# Patient Record
Sex: Male | Born: 1995
Health system: Southern US, Community
[De-identification: ages and names within clinical notes are randomized; demographics above are authoritative.]

## PROBLEM LIST (undated history)

## (undated) ENCOUNTER — Emergency Department: Discharge status: Absent without leave | Payer: Self-pay

## (undated) DIAGNOSIS — J45909 Unspecified asthma, uncomplicated: Secondary | ICD-10-CM

## (undated) DIAGNOSIS — J309 Allergic rhinitis, unspecified: Secondary | ICD-10-CM

## (undated) DIAGNOSIS — E663 Overweight: Secondary | ICD-10-CM

## (undated) DIAGNOSIS — N442 Benign cyst of testis: Secondary | ICD-10-CM

## (undated) DIAGNOSIS — R569 Unspecified convulsions: Secondary | ICD-10-CM

## (undated) HISTORY — DX: Unspecified asthma, uncomplicated: J45.909

## (undated) HISTORY — DX: Allergic rhinitis, unspecified: J30.9

## (undated) HISTORY — DX: Overweight: E66.3

---
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
## 2018-06-07 ENCOUNTER — Encounter: Payer: Self-pay | Admitting: Emergency Medicine

## 2018-06-07 ENCOUNTER — Emergency Department
Admission: EM | Admit: 2018-06-07 | Discharge: 2018-06-07 | Disposition: A | Discharge status: Home / Self Care | Payer: Medicaid Other | Attending: Emergency Medicine | Admitting: Emergency Medicine

## 2018-06-07 ENCOUNTER — Emergency Department: Payer: Medicaid Other

## 2018-06-07 DIAGNOSIS — J209 Acute bronchitis, unspecified: Secondary | ICD-10-CM | POA: Insufficient documentation

## 2018-06-07 DIAGNOSIS — F17210 Nicotine dependence, cigarettes, uncomplicated: Secondary | ICD-10-CM | POA: Insufficient documentation

## 2018-06-07 DIAGNOSIS — R05 Cough: Secondary | ICD-10-CM | POA: Diagnosis present

## 2018-06-07 DIAGNOSIS — J45909 Unspecified asthma, uncomplicated: Secondary | ICD-10-CM | POA: Insufficient documentation

## 2018-06-07 HISTORY — DX: Unspecified convulsions: R56.9

## 2018-06-07 MED ORDER — AZITHROMYCIN 250 MG PO TABS: ORAL_TABLET | ORAL | 0 refills | Status: DC

## 2018-06-07 NOTE — ED Triage Notes (Signed)
Patient presents to ED via POV from home with c/o nasal congestion, cough and fever. Max fever at home was 101.5. Patient has been taking tylenol at home. Patient noted to have even and non labored respirations. Able to speak full, complete sentences.

## 2018-06-07 NOTE — ED Triage Notes (Signed)
C/O fever, cough, and congestion x 6 days.  AAOx3.  Skin warm and dry.  Last medicated for fever last night.

## 2018-06-07 NOTE — ED Notes (Signed)
See triage note  Presents with cough and some nasal congestion  States he has had fever at home but is afebrile on arrival

## 2018-06-07 NOTE — ED Provider Notes (Signed)
[facility] Emergency Department Provider Note  ____________________________________________   First MD Initiated Contact with Patient 06/07/18 [phone]     (approximate)  I have reviewed the triage vital signs and the nursing notes.   HISTORY  Chief Complaint Cough and Fever    HPI Curtis Burns is a 22 y.o. male presents to the emergency department complaining of cough and congestion with fever.  Fever since last Tuesday.  He states that he did have some body aches.  He states mucus has turned yellow to green.  He denies any chest pain or shortness of breath, nausea or vomiting.    Past Medical History:  Diagnosis Date  . Allergic rhinitis   . Asthma   . Overweight   . Seizures (HCC)     There are no active problems to display for this patient.   History reviewed. No pertinent surgical history.  Prior to Admission medications   Medication Sig Start Date End Date Taking? Authorizing Provider  azithromycin (ZITHROMAX Z-PAK) 250 MG tablet 2 pills today then 1 pill a day for 4 days 06/07/18   Faythe GheeFisher, Susan W, PA-C    Allergies Patient has no known allergies.  Family History  Problem Relation Age of Onset  . Kidney Stones Father   . Kidney Stones Sister     Social History Social History   Tobacco Use  . Smoking status: Current Every Day Smoker    Packs/day: 0.50    Types: Cigarettes  . Smokeless tobacco: Never Used  Substance Use Topics  . Alcohol use: No  . Drug use: No    Review of Systems  Constitutional: Positive fever/chills Eyes: No visual changes. ENT: No sore throat. Respiratory: Positive cough Genitourinary: Negative for dysuria. Musculoskeletal: Negative for back pain. Skin: Negative for rash.    ____________________________________________   PHYSICAL EXAM:  VITAL SIGNS: ED Triage Vitals  Enc Vitals Group     BP 06/07/18 0759 129/72     Pulse Rate 06/07/18 0759 85     Resp 06/07/18 0759 18   Temp 06/07/18 0759 98.5 F (36.9 C)     Temp Source 06/07/18 0759 Oral     SpO2 06/07/18 0759 98 %     Weight 06/07/18 0800 210 lb (95.3 kg)     Height 06/07/18 0800 6\' 1"  (1.854 m)     Head Circumference --      Peak Flow --      Pain Score --      Pain Loc --      Pain Edu? --      Excl. in GC? --     Constitutional: Alert and oriented. Well appearing and in no acute distress. Eyes: Conjunctivae are normal.  Head: Atraumatic. Nose: No congestion/rhinnorhea. Mouth/Throat: Mucous membranes are moist.  It appears normal Neck:  supple no lymphadenopathy noted Cardiovascular: Normal rate, regular rhythm. Heart sounds are normal Respiratory: Normal respiratory effort.  No retractions, lungs c t a  GU: deferred Musculoskeletal: FROM all extremities, warm and well perfused Neurologic:  Normal speech and language.  Skin:  Skin is warm, dry and intact. No rash noted. Psychiatric: Mood and affect are normal. Speech and behavior are normal.  ____________________________________________   LABS (all labs ordered are listed, but only abnormal results are displayed)  Labs Reviewed - No data to display ____________________________________________   ____________________________________________  RADIOLOGY  Chest x-ray is negative  ____________________________________________   PROCEDURES  Procedure(s) performed:No  Procedures    ____________________________________________  INITIAL IMPRESSION / ASSESSMENT AND PLAN / ED COURSE  Pertinent labs & imaging results that were available during my care of the patient were reviewed by me and considered in my medical decision making (see chart for details).   Pt with fever and cough for 1 week.   PE shows dry hacking cough.    cxr is neg for pneumonia  Discussed x-ray results with the patient.  He was given a prescription for Z-Pak.  He is to take over-the-counter Mucinex.  Tylenol and ibuprofen for fever as needed.  He was  given a work note stating that he should not return to work until he is been fever free for 24 hours.  He states he understands will comply.  He is to see his regular doctor if not better in 3 days or return emergency department if worsening.  He was discharged in stable condition.     As part of my medical decision making, I reviewed the following data within the electronic MEDICAL RECORD NUMBER History obtained from family, Nursing notes reviewed and incorporated, Old chart reviewed, Radiograph reviewed chest x-rays negative, Notes from prior ED visits and [place] Controlled Substance Database  ____________________________________________   FINAL CLINICAL IMPRESSION(S) / ED DIAGNOSES  Final diagnoses:  Acute bronchitis, unspecified organism      NEW MEDICATIONS STARTED DURING THIS VISIT:  Discharge Medication List as of 06/07/2018  9:24 AM    START taking these medications   Details  azithromycin (ZITHROMAX Z-PAK) 250 MG tablet 2 pills today then 1 pill a day for 4 days, Normal         Note:  This document was prepared using Dragon voice recognition software and may include unintentional dictation errors.    Faythe Ghee, PA-C 06/07/18 1610    Sharman Cheek, MD 06/07/18 [phone]

## 2018-06-13 ENCOUNTER — Encounter: Payer: Self-pay | Admitting: Emergency Medicine

## 2018-06-13 ENCOUNTER — Emergency Department
Admission: EM | Admit: 2018-06-13 | Discharge: 2018-06-13 | Disposition: A | Discharge status: Home / Self Care | Payer: Medicaid Other | Attending: Emergency Medicine | Admitting: Emergency Medicine

## 2018-06-13 ENCOUNTER — Other Ambulatory Visit: Payer: Self-pay

## 2018-06-13 DIAGNOSIS — R070 Pain in throat: Secondary | ICD-10-CM | POA: Diagnosis present

## 2018-06-13 DIAGNOSIS — R0602 Shortness of breath: Secondary | ICD-10-CM | POA: Insufficient documentation

## 2018-06-13 DIAGNOSIS — J45909 Unspecified asthma, uncomplicated: Secondary | ICD-10-CM | POA: Insufficient documentation

## 2018-06-13 DIAGNOSIS — F17210 Nicotine dependence, cigarettes, uncomplicated: Secondary | ICD-10-CM | POA: Insufficient documentation

## 2018-06-13 DIAGNOSIS — J209 Acute bronchitis, unspecified: Secondary | ICD-10-CM | POA: Diagnosis not present

## 2018-06-13 MED ORDER — IPRATROPIUM BROMIDE 0.02 % IN SOLN: 0.5000 mg | Freq: Four times a day (QID) | RESPIRATORY_TRACT | 0 refills | Status: DC

## 2018-06-13 MED ORDER — IPRATROPIUM-ALBUTEROL 0.5-2.5 (3) MG/3ML IN SOLN
3.0000 mL | Freq: Once | RESPIRATORY_TRACT | Status: AC
  Administered 2018-06-13: 3 mL via RESPIRATORY_TRACT
  Filled 2018-06-13: qty 3

## 2018-06-13 MED ORDER — PREDNISONE 10 MG PO TABS: ORAL_TABLET | ORAL | 0 refills | Status: DC

## 2018-06-13 NOTE — Discharge Instructions (Signed)
Follow-up with Dr. Charise Carwinhristman if any continued problems.  Begin taking prednisone today and also continue with your nebulizer treatments as needed.  Increase fluids.  Decrease smoking or discontinue completely.

## 2018-06-13 NOTE — ED Provider Notes (Signed)
[facility] Emergency Department Provider Note  ____________________________________________   First MD Initiated Contact with Patient 06/13/18 1037     (approximate)  I have reviewed the triage vital signs and the nursing notes.   HISTORY  Chief Complaint Sore Throat and Shortness of Breath   HPI Curtis Burns is a 22 y.o. male presents to the ED with complaint of continued sore throat, cough and shortness of breath.  Patient was seen approximately 1 week ago at which time his chest x-ray was reported as negative.  Patient was given a Z-Pak which he finished 2 days ago.  Father states that he was doing much better but then went out, got wet, began having symptoms.  Patient has continued talking complete sentences-not had any respiratory difficulties.  Patient has nebulizer machine at home which he used last evening.  He rates his pain as 5/10.  Patient also smokes 1/2 pack cigarettes per day and vapes.   Past Medical History:  Diagnosis Date  . Allergic rhinitis   . Asthma   . Overweight   . Seizures (HCC)     There are no active problems to display for this patient.   History reviewed. No pertinent surgical history.  Prior to Admission medications   Medication Sig Start Date End Date Taking? Authorizing Provider  azithromycin (ZITHROMAX Z-PAK) 250 MG tablet 2 pills today then 1 pill a day for 4 days 06/07/18   Sherrie Mustache Roselyn Bering, PA-C  ipratropium (ATROVENT) 0.02 % nebulizer solution Take 2.5 mLs (0.5 mg total) by nebulization 4 (four) times daily. 06/13/18   Tommi Rumps, PA-C  predniSONE (DELTASONE) 10 MG tablet Take 6 tablets  today, on day 2 take 5 tablets, day 3 take 4 tablets, day 4 take 3 tablets, day 5 take  2 tablets and 1 tablet the last day 06/13/18   Tommi Rumps, PA-C    Allergies Patient has no known allergies.  Family History  Problem Relation Age of Onset  . Kidney Stones Father   . Kidney Stones Sister      Social History Social History   Tobacco Use  . Smoking status: Current Every Day Smoker    Packs/day: 0.50    Types: Cigarettes  . Smokeless tobacco: Never Used  Substance Use Topics  . Alcohol use: No  . Drug use: No    Review of Systems Constitutional: No fever/chills Eyes: No visual changes. ENT: Positive sore throat.  Needed for ear pain. Cardiovascular: Denies chest pain. Respiratory: Positive shortness of breath and productive cough. Gastrointestinal: No abdominal pain.  No nausea, no vomiting.  Musculoskeletal: Negative for muscle aches. Skin: Negative for rash. Neurological: Negative for headaches, focal weakness or numbness. ___________________________________________   PHYSICAL EXAM:  VITAL SIGNS: ED Triage Vitals  Enc Vitals Group     BP 06/13/18 1017 (!) 112/52     Pulse Rate 06/13/18 1017 61     Resp 06/13/18 1017 20     Temp 06/13/18 1017 98.1 F (36.7 C)     Temp Source 06/13/18 1017 Oral     SpO2 06/13/18 1017 100 %     Weight 06/13/18 1018 210 lb (95.3 kg)     Height 06/13/18 1018 6\' 1"  (1.854 m)     Head Circumference --      Peak Flow --      Pain Score 06/13/18 1022 5     Pain Loc --      Pain Edu? --  Excl. in GC? --    Constitutional: Alert and oriented. Well appearing and in no acute distress.  Patient is able talking complete sentences without any difficulties and no distress was noted. Eyes: Conjunctivae are normal.  Head: Atraumatic. Nose: Mild congestion/rhinnorhea. Mouth/Throat: Mucous membranes are moist.  Oropharynx non-erythematous.  No exudate, posterior drainage present.  Uvula is midline. Neck: No stridor.   Hematological/Lymphatic/Immunilogical: No cervical lymphadenopathy. Cardiovascular: Normal rate, regular rhythm. Grossly normal heart sounds.  Good peripheral circulation. Respiratory: Normal respiratory effort.  No retractions. Lungs CTAB occasional congested cough.  Patient is able to speak in complete sentences  without any difficulties. Gastrointestinal: Soft and nontender. No distention.  Musculoskeletal: Moves upper and lower extremities without any difficulty and normal gait was noted. Neurologic:  Normal speech and language. No gross focal neurologic deficits are appreciated. No gait instability. Skin:  Skin is warm, dry and intact. No rash noted. Psychiatric: Mood and affect are normal. Speech and behavior are normal.  ____________________________________________   LABS (all labs ordered are listed, but only abnormal results are displayed)  Labs Reviewed - No data to display  RADIOLOGY  ED MD interpretation:   Chest x-ray today is deferred an x-ray from 06/07/2018 was reviewed.  Official radiology report(s): No results found.  ____________________________________________   PROCEDURES  Procedure(s) performed: None  Procedures  Critical Care performed: No  ____________________________________________   INITIAL IMPRESSION / ASSESSMENT AND PLAN / ED COURSE  As part of my medical decision making, I reviewed the following data within the electronic MEDICAL RECORD NUMBER Notes from prior ED visits and [name] Controlled Substance Database  22 year old male cigarette smoker presents to the ED today with complaint of continued cough.  He was seen on 06/07/2018 at which time his chest x-ray was negative.  He was placed on Zithromax which he finished 2 days ago.  Patient states he has had a history of bronchitis.  He denies any fever.  Physical exam is more consistent with bronchitis and patient is a cigarette smoker.  He was encouraged to discontinue smoking.  He was given a nebulizer treatment in the ED with improvement of his cough.  Patient will start prednisone 60 mg 6-day taper and to follow-up with his PCP if any continued problems.  He was also given a refill on his nebulizer solution.  ____________________________________________   FINAL CLINICAL IMPRESSION(S) / ED DIAGNOSES  Final  diagnoses:  Acute bronchitis, unspecified organism  Cigarette smoker     ED Discharge Orders         Ordered    ipratropium (ATROVENT) 0.02 % nebulizer solution  4 times daily     06/13/18 1133    predniSONE (DELTASONE) 10 MG tablet     06/13/18 1133           Note:  This document was prepared using Dragon voice recognition software and may include unintentional dictation errors.    Tommi RumpsSummers,  L, PA-C 06/13/18 1221    Minna AntisPaduchowski, Kevin, MD 06/13/18 1419

## 2018-06-13 NOTE — ED Triage Notes (Signed)
Pt to ED via POV, pt states that he finished antibiotics 2 days ago for bronchitis, pt reports that he is having sore throat and some shortness of breath. Pt thinks he may be getting pneumonia. Pt is able to talk in complete sentences at this time, no respiratory distress noted, pt is in NAD.

## 2018-06-22 ENCOUNTER — Emergency Department: Payer: Medicaid Other

## 2018-06-22 ENCOUNTER — Other Ambulatory Visit: Payer: Self-pay

## 2018-06-22 ENCOUNTER — Emergency Department
Admission: EM | Admit: 2018-06-22 | Discharge: 2018-06-22 | Disposition: A | Discharge status: Home / Self Care | Payer: Medicaid Other | Attending: Emergency Medicine | Admitting: Emergency Medicine

## 2018-06-22 ENCOUNTER — Encounter: Payer: Self-pay | Admitting: Emergency Medicine

## 2018-06-22 DIAGNOSIS — F17210 Nicotine dependence, cigarettes, uncomplicated: Secondary | ICD-10-CM | POA: Diagnosis not present

## 2018-06-22 DIAGNOSIS — N453 Epididymo-orchitis: Secondary | ICD-10-CM | POA: Diagnosis not present

## 2018-06-22 DIAGNOSIS — N50812 Left testicular pain: Secondary | ICD-10-CM

## 2018-06-22 DIAGNOSIS — J45909 Unspecified asthma, uncomplicated: Secondary | ICD-10-CM | POA: Insufficient documentation

## 2018-06-22 LAB — URINALYSIS, COMPLETE (UACMP) WITH MICROSCOPIC
Bacteria, UA: NONE SEEN
Bilirubin Urine: NEGATIVE
Glucose, UA: NEGATIVE mg/dL
Hgb urine dipstick: NEGATIVE
Ketones, ur: NEGATIVE mg/dL
Leukocytes, UA: NEGATIVE
Nitrite: NEGATIVE
Protein, ur: NEGATIVE mg/dL
Specific Gravity, Urine: 1.014 (ref 1.005–1.030)
Squamous Epithelial / HPF: NONE SEEN (ref 0–5)
pH: 5 (ref 5.0–8.0)

## 2018-06-22 MED ORDER — CEFTRIAXONE SODIUM 250 MG IJ SOLR
250.0000 mg | INTRAMUSCULAR | Status: DC
  Administered 2018-06-22: 250 mg via INTRAMUSCULAR
  Filled 2018-06-22: qty 250

## 2018-06-22 MED ORDER — DOXYCYCLINE HYCLATE 100 MG PO CAPS: 100.0000 mg | ORAL_CAPSULE | Freq: Two times a day (BID) | ORAL | 0 refills | Status: AC

## 2018-06-22 MED ORDER — IBUPROFEN 800 MG PO TABS: 800.0000 mg | ORAL_TABLET | Freq: Three times a day (TID) | ORAL | 0 refills | Status: DC | PRN

## 2018-06-22 NOTE — ED Notes (Signed)
Pt alert and oriented X4, active, cooperative, pt in NAD. RR even and unlabored, color WNL.  Pt informed to return if any life threatening symptoms occur.  Discharge and followup instructions reviewed. Ambulates safely. 

## 2018-06-22 NOTE — ED Provider Notes (Signed)
[facility] Emergency Department Provider Note       Time seen: ----------------------------------------- 9:21 AM on 06/22/2018 -----------------------------------------   I have reviewed the triage vital signs and the nursing notes.  HISTORY   Chief Complaint Testicle Pain    HPI Curtis Burns is a 22 y.o. male with a history of allergic rhinitis, asthma, seizures who presents to the ED for left-sided testicular pain that started yesterday morning.  Patient has not had any swelling that he knows of, denies any dysuria or discharge.  He does state that he is having unprotected sex.  Past Medical History:  Diagnosis Date  . Allergic rhinitis   . Asthma   . Overweight   . Seizures (HCC)     There are no active problems to display for this patient.   History reviewed. No pertinent surgical history.  Allergies Prednisone  Social History Social History   Tobacco Use  . Smoking status: Current Every Day Smoker    Packs/day: 0.50    Types: Cigarettes  . Smokeless tobacco: Never Used  Substance Use Topics  . Alcohol use: No  . Drug use: No   Review of Systems Constitutional: Negative for fever. Cardiovascular: Negative for chest pain. Respiratory: Negative for shortness of breath. Gastrointestinal: Negative for abdominal pain, vomiting and diarrhea. Genitourinary: Positive for left-sided testicular pain Musculoskeletal: Negative for back pain. Skin: Negative for rash. Neurological: Negative for headaches, focal weakness or numbness.  All systems negative/normal/unremarkable except as stated in the HPI  ____________________________________________   PHYSICAL EXAM:  VITAL SIGNS: ED Triage Vitals  Enc Vitals Group     BP 06/22/18 0758 (!) 124/51     Pulse Rate 06/22/18 0758 69     Resp 06/22/18 0758 16     Temp 06/22/18 0758 98.1 F (36.7 C)     Temp Source 06/22/18 0758 Oral     SpO2 06/22/18 0758 99 %     Weight  06/22/18 0759 209 lb 14.1 oz (95.2 kg)     Height 06/22/18 0759 6\' 1"  (1.854 m)     Head Circumference --      Peak Flow --      Pain Score 06/22/18 0758 6     Pain Loc --      Pain Edu? --      Excl. in GC? --    Constitutional: Alert and oriented. Well appearing and in no distress. Cardiovascular: Normal rate, regular rhythm. No murmurs, rubs, or gallops. Respiratory: Normal respiratory effort without tachypnea nor retractions. Breath sounds are clear and equal bilaterally. No wheezes/rales/rhonchi. Gastrointestinal: Soft and nontender. Normal bowel sounds Genitourinary: Mild left testicular tenderness with mild swelling.No hernia is present Musculoskeletal: Nontender with normal range of motion in extremities. No lower extremity tenderness nor edema. Neurologic:  Normal speech and language. No gross focal neurologic deficits are appreciated.  Skin:  Skin is warm, dry and intact. No rash noted.  ____________________________________________  ED COURSE:  As part of my medical decision making, I reviewed the following data within the electronic MEDICAL RECORD NUMBER History obtained from family if available, nursing notes, old chart and ekg, as well as notes from prior ED visits. Patient presented for left-sided testicular pain, we will assess with labs and imaging as indicated at this time.   Procedures ____________________________________________   LABS (pertinent positives/negatives)  Labs Reviewed  URINALYSIS, COMPLETE (UACMP) WITH MICROSCOPIC - Abnormal; Notable for the following components:      Result Value   Color, Urine  YELLOW (*)    APPearance CLEAR (*)    All other components within normal limits    RADIOLOGY  Testicular ultrasound IMPRESSION: 1. LEFT orchitis. 2. No evidence of testicular torsion. 3. Cysts or spermatoceles involving both epididymal heads. 4. LEFT sided varicocele. ____________________________________________  DIFFERENTIAL DIAGNOSIS    Epididymitis, orchitis, cellulitis  FINAL ASSESSMENT AND PLAN  Epididymal orchitis   Plan: The patient had presented for left-sided testicular pain which is likely secondary to chlamydia. Patient's imaging revealed left orchitis.  Patient will be given Rocephin IM here and treated with oral doxycycline.   Ulice DashJohnathan E Jazzmin Newbold, MD   Note: This note was generated in part or whole with voice recognition software. Voice recognition is usually quite accurate but there are transcription errors that can and very often do occur. I apologize for any typographical errors that were not detected and corrected.     Emily FilbertWilliams, Amariyon Maynes E, MD 06/22/18 [phone]

## 2018-06-22 NOTE — ED Triage Notes (Signed)
Left testicle pain started yesterday am.

## 2018-09-13 ENCOUNTER — Emergency Department: Payer: 59

## 2018-09-13 ENCOUNTER — Encounter: Payer: Self-pay | Admitting: Emergency Medicine

## 2018-09-13 ENCOUNTER — Emergency Department
Admission: EM | Admit: 2018-09-13 | Discharge: 2018-09-13 | Disposition: A | Discharge status: Home / Self Care | Payer: 59 | Attending: Emergency Medicine | Admitting: Emergency Medicine

## 2018-09-13 DIAGNOSIS — J45909 Unspecified asthma, uncomplicated: Secondary | ICD-10-CM | POA: Insufficient documentation

## 2018-09-13 DIAGNOSIS — Z79899 Other long term (current) drug therapy: Secondary | ICD-10-CM | POA: Insufficient documentation

## 2018-09-13 DIAGNOSIS — F17210 Nicotine dependence, cigarettes, uncomplicated: Secondary | ICD-10-CM | POA: Insufficient documentation

## 2018-09-13 DIAGNOSIS — N442 Benign cyst of testis: Secondary | ICD-10-CM | POA: Insufficient documentation

## 2018-09-13 LAB — CHLAMYDIA/NGC RT PCR (ARMC ONLY)
CHLAMYDIA TR: NOT DETECTED
N GONORRHOEAE: NOT DETECTED

## 2018-09-13 MED ORDER — NAPROXEN 500 MG PO TABS: 500.0000 mg | ORAL_TABLET | Freq: Two times a day (BID) | ORAL | Status: DC

## 2018-09-13 MED ORDER — NAPROXEN 500 MG PO TABS
500.0000 mg | ORAL_TABLET | Freq: Once | ORAL | Status: AC
  Administered 2018-09-13: 500 mg via ORAL
  Filled 2018-09-13: qty 1

## 2018-09-13 MED ORDER — TRAMADOL HCL 50 MG PO TABS: 50.0000 mg | ORAL_TABLET | Freq: Two times a day (BID) | ORAL | 0 refills | Status: DC | PRN

## 2018-09-13 MED ORDER — TRAMADOL HCL 50 MG PO TABS
50.0000 mg | ORAL_TABLET | Freq: Once | ORAL | Status: AC
Start: 2018-09-13 — End: 2018-09-13
  Administered 2018-09-13: 50 mg via ORAL
  Filled 2018-09-13: qty 1

## 2018-09-13 MED ORDER — HYDROMORPHONE HCL 1 MG/ML IJ SOLN: 1.0000 mg | Freq: Once | INTRAMUSCULAR | Status: DC

## 2018-09-13 NOTE — Discharge Instructions (Signed)
Follow-up with urology if as needed.

## 2018-09-13 NOTE — ED Triage Notes (Signed)
Pt reports swelling and pain to right testicle for the past 2-3 days. Pt denies injuries or urinary sx's. Pt reports has been seen in the past for the same but it was his left testicle.

## 2018-09-13 NOTE — ED Provider Notes (Signed)
[facility] Emergency Department Provider Note   ____________________________________________   First MD Initiated Contact with Patient 09/13/18 [phone]     (approximate)  I have reviewed the triage vital signs and the nursing notes.   HISTORY  Chief Complaint Testicle Pain and Groin Swelling     HPI Curtis Burns is a 23 y.o. male patient presents with right scrotal pain and edema.  Patient states similar complaint 2 years ago on the left side.  Patient denies dysuria or urethral discharge.  Patient denies high risk sexual encounter.  Patient rates his pain as a 5/10.  No palliative measure for complaint.  Patient described the pain is "achy".         Past Medical History:  Diagnosis Date  . Allergic rhinitis   . Asthma   . Overweight   . Seizures (HCC)     There are no active problems to display for this patient.   History reviewed. No pertinent surgical history.  Prior to Admission medications   Medication Sig Start Date End Date Taking? Authorizing Provider  ipratropium (ATROVENT) 0.02 % nebulizer solution Take 2.5 mLs (0.5 mg total) by nebulization 4 (four) times daily. 06/13/18   Tommi Rumps, PA-C  naproxen (NAPROSYN) 500 MG tablet Take 1 tablet (500 mg total) by mouth 2 (two) times daily with a meal. 09/13/18   Joni Reining, PA-C  traMADol (ULTRAM) 50 MG tablet Take 1 tablet (50 mg total) by mouth every 12 (twelve) hours as needed. 09/13/18   Joni Reining, PA-C    Allergies Prednisone  Family History  Problem Relation Age of Onset  . Kidney Stones Father   . Kidney Stones Sister     Social History Social History   Tobacco Use  . Smoking status: Current Every Day Smoker    Packs/day: 0.50    Types: Cigarettes  . Smokeless tobacco: Never Used  Substance Use Topics  . Alcohol use: No  . Drug use: No    Review of Systems  Constitutional: No fever/chills Eyes: No visual changes. ENT: No sore  throat. Cardiovascular: Denies chest pain. Respiratory: Denies shortness of breath. Gastrointestinal: No abdominal pain.  No nausea, no vomiting.  No diarrhea.  No constipation. Genitourinary: Negative for dysuria.  Left scrotum pain. Musculoskeletal: Negative for back pain. Skin: Negative for rash. Neurological: Negative for headaches, focal weakness or numbness.   ____________________________________________   PHYSICAL EXAM:  VITAL SIGNS: ED Triage Vitals [09/13/18 0819]  Enc Vitals Group     BP (!) 132/59     Pulse Rate 61     Resp 20     Temp 98.2 F (36.8 C)     Temp Source Oral     SpO2 98 %     Weight 220 lb (99.8 kg)     Height 6\' 1"  (1.854 m)     Head Circumference      Peak Flow      Pain Score 5     Pain Loc      Pain Edu?      Excl. in GC?    Constitutional: Alert and oriented. Well appearing and in no acute distress. Neck: No stridor.  Hematological/Lymphatic/Immunilogical: No cervical lymphadenopathy. Cardiovascular: Normal rate, regular rhythm. Grossly normal heart sounds.  Good peripheral circulation. Respiratory: Normal respiratory effort.  No retractions. Lungs CTAB. Gastrointestinal: Soft and nontender. No distention. No abdominal bruits. No CVA tenderness. Genitourinary: No obvious scrotum edema.  Palpable cystic lesion right scrotum.  Musculoskeletal: No lower extremity tenderness nor edema.  No joint effusions. Neurologic:  Normal speech and language. No gross focal neurologic deficits are appreciated. No gait instability. Skin:  Skin is warm, dry and intact. No rash noted. Psychiatric: Mood and affect are normal. Speech and behavior are normal.  ____________________________________________   LABS (all labs ordered are listed, but only abnormal results are displayed)  Labs Reviewed  CHLAMYDIA/NGC RT PCR [facility] ONLY)    ____________________________________________  EKG   ____________________________________________  RADIOLOGY  ED MD interpretation:   Official radiology report(s): US Scrotum W/doppler  Result Date: 09/13/2018 CLINICAL DATA:  Right testicular pain and swelling 2-3 days. EXAM: SCROTAL ULTRASOUND DOPPLER ULTRASOUND OF THE TESTICLES TECHNIQUE: Complete ultrasound examination of the testicles, epididymis, and other scrotal structures was performed. Color and spectral Doppler ultrasound were also utilized to evaluate blood flow to the testicles. COMPARISON:  None. FINDINGS: Right testicle Measurements: 2.0 x 3.1 x 4.9 cm. No mass or microlithiasis visualized. Left testicle Measurements: 1.7 x 2.7 x 3.5 cm. No mass or microlithiasis visualized. Right epididymis: Normal in size and appearance. 4 mm cyst over the epididymal head. Left epididymis: Normal in size and appearance. 1.2 cm cyst over the epididymal head. Hydrocele:  None visualized. Varicocele:  None visualized. Pulsed Doppler interrogation of both testes demonstrates normal low resistance arterial and venous waveforms bilaterally. IMPRESSION: Normal testicular ultrasound without evidence of focal mass or torsion. Bilateral epididymal cysts. Electronically Signed   By: Elberta Fortis M.D.   On: 09/13/2018 09:10    ____________________________________________   PROCEDURES  Procedure(s) performed (including Critical Care):  Procedures   ____________________________________________   INITIAL IMPRESSION / ASSESSMENT AND PLAN / ED COURSE  As part of my medical decision making, I reviewed the following data within the electronic MEDICAL RECORD NUMBER         Patient presents with right scrotum pain.  Ultrasound revealed 4 mm epididymal cyst.  Patient given discharge care instruction advised follow urology for definitive evaluation and treatment.      ____________________________________________   FINAL CLINICAL IMPRESSION(S) / ED  DIAGNOSES  Final diagnoses:  Benign cyst of both testicles     ED Discharge Orders         Ordered    traMADol (ULTRAM) 50 MG tablet  Every 12 hours PRN     09/13/18 1120    naproxen (NAPROSYN) 500 MG tablet  2 times daily with meals     09/13/18 1120           Note:  This document was prepared using Dragon voice recognition software and may include unintentional dictation errors.    Joni Reining, PA-C 09/13/18 1143    Sharman Cheek, MD 09/17/18 [phone]

## 2018-09-13 NOTE — ED Notes (Signed)
See triage note  Presents with testicle swelling   States he noticed this couple of days ago   Hx of same but to other side  Denies any trauma or diff with urination

## 2019-01-17 ENCOUNTER — Ambulatory Visit: Payer: Self-pay | Admitting: Family Medicine

## 2019-01-27 ENCOUNTER — Emergency Department: Payer: Self-pay

## 2019-01-27 ENCOUNTER — Other Ambulatory Visit: Payer: Self-pay

## 2019-01-27 ENCOUNTER — Emergency Department
Admission: EM | Admit: 2019-01-27 | Discharge: 2019-01-27 | Disposition: A | Discharge status: Home / Self Care | Payer: Self-pay | Attending: Emergency Medicine | Admitting: Emergency Medicine

## 2019-01-27 ENCOUNTER — Encounter: Payer: Self-pay | Admitting: Emergency Medicine

## 2019-01-27 DIAGNOSIS — R197 Diarrhea, unspecified: Secondary | ICD-10-CM | POA: Insufficient documentation

## 2019-01-27 DIAGNOSIS — R112 Nausea with vomiting, unspecified: Secondary | ICD-10-CM | POA: Insufficient documentation

## 2019-01-27 DIAGNOSIS — F17210 Nicotine dependence, cigarettes, uncomplicated: Secondary | ICD-10-CM | POA: Insufficient documentation

## 2019-01-27 DIAGNOSIS — J45909 Unspecified asthma, uncomplicated: Secondary | ICD-10-CM | POA: Insufficient documentation

## 2019-01-27 DIAGNOSIS — N23 Unspecified renal colic: Secondary | ICD-10-CM | POA: Insufficient documentation

## 2019-01-27 HISTORY — DX: Benign cyst of testis: N44.2

## 2019-01-27 LAB — URINALYSIS, COMPLETE (UACMP) WITH MICROSCOPIC
Bacteria, UA: NONE SEEN
Bilirubin Urine: NEGATIVE
Glucose, UA: NEGATIVE mg/dL
Ketones, ur: NEGATIVE mg/dL
Leukocytes,Ua: NEGATIVE
Nitrite: NEGATIVE
Protein, ur: NEGATIVE mg/dL
Specific Gravity, Urine: >1.046 — ABNORMAL HIGH (ref 1.005–1.030)
pH: 5 (ref 5.0–8.0)

## 2019-01-27 LAB — CBC WITH DIFFERENTIAL/PLATELET
Abs Immature Granulocytes: 0.03 10*3/uL (ref 0.00–0.07)
Basophils Absolute: 0 10*3/uL (ref 0.0–0.1)
Basophils Relative: 0 %
Eosinophils Absolute: 0.1 10*3/uL (ref 0.0–0.5)
Eosinophils Relative: 1 %
HCT: 45.5 % (ref 39.0–52.0)
Hemoglobin: 14.9 g/dL (ref 13.0–17.0)
Immature Granulocytes: 0 %
Lymphocytes Relative: 13 %
Lymphs Abs: 1.5 10*3/uL (ref 0.7–4.0)
MCH: 29.7 pg (ref 26.0–34.0)
MCHC: 32.7 g/dL (ref 30.0–36.0)
MCV: 90.6 fL (ref 80.0–100.0)
Monocytes Absolute: 0.6 10*3/uL (ref 0.1–1.0)
Monocytes Relative: 6 %
Neutro Abs: 8.9 10*3/uL — ABNORMAL HIGH (ref 1.7–7.7)
Neutrophils Relative %: 80 %
Platelets: 290 10*3/uL (ref 150–400)
RBC: 5.02 MIL/uL (ref 4.22–5.81)
RDW: 12.4 % (ref 11.5–15.5)
WBC: 11.1 10*3/uL — ABNORMAL HIGH (ref 4.0–10.5)
nRBC: 0 % (ref 0.0–0.2)

## 2019-01-27 LAB — COMPREHENSIVE METABOLIC PANEL
ALT: 31 U/L (ref 0–44)
AST: 22 U/L (ref 15–41)
Albumin: 4.7 g/dL (ref 3.5–5.0)
Alkaline Phosphatase: 56 U/L (ref 38–126)
Anion gap: 10 (ref 5–15)
BUN: 16 mg/dL (ref 6–20)
CO2: 27 mmol/L (ref 22–32)
Calcium: 9.6 mg/dL (ref 8.9–10.3)
Chloride: 104 mmol/L (ref 98–111)
Creatinine, Ser: 1.15 mg/dL (ref 0.61–1.24)
GFR calc Af Amer: >60 mL/min (ref >60)
GFR calc non Af Amer: >60 mL/min (ref >60)
Glucose, Bld: 116 mg/dL — ABNORMAL HIGH (ref 70–99)
Potassium: 3.5 mmol/L (ref 3.5–5.1)
Sodium: 141 mmol/L (ref 135–145)
Total Bilirubin: 0.9 mg/dL (ref 0.3–1.2)
Total Protein: 7.7 g/dL (ref 6.5–8.1)

## 2019-01-27 LAB — LIPASE, BLOOD: Lipase: 33 U/L (ref 11–51)

## 2019-01-27 MED ORDER — KETOROLAC TROMETHAMINE 30 MG/ML IJ SOLN
30.0000 mg | Freq: Once | INTRAMUSCULAR | Status: AC
  Administered 2019-01-27: 11:00:00 30 mg via INTRAVENOUS
  Filled 2019-01-27: qty 1

## 2019-01-27 MED ORDER — TAMSULOSIN HCL 0.4 MG PO CAPS: 0.4000 mg | ORAL_CAPSULE | Freq: Every day | ORAL | 0 refills | Status: DC

## 2019-01-27 MED ORDER — ONDANSETRON HCL 4 MG/2ML IJ SOLN
4.0000 mg | Freq: Once | INTRAMUSCULAR | Status: AC
  Administered 2019-01-27: 10:00:00 4 mg via INTRAVENOUS
  Filled 2019-01-27: qty 2

## 2019-01-27 MED ORDER — ONDANSETRON HCL 4 MG/2ML IJ SOLN
4.0000 mg | Freq: Once | INTRAMUSCULAR | Status: AC
  Administered 2019-01-27: 10:00:00 4 mg via INTRAVENOUS

## 2019-01-27 MED ORDER — MORPHINE SULFATE (PF) 4 MG/ML IV SOLN
4.0000 mg | Freq: Once | INTRAVENOUS | Status: AC
  Administered 2019-01-27: 10:00:00 4 mg via INTRAVENOUS
  Filled 2019-01-27: qty 1

## 2019-01-27 MED ORDER — HYDROMORPHONE HCL 1 MG/ML IJ SOLN
1.0000 mg | Freq: Once | INTRAMUSCULAR | Status: AC
  Administered 2019-01-27: 10:00:00 1 mg via INTRAVENOUS
  Filled 2019-01-27: qty 1

## 2019-01-27 MED ORDER — ONDANSETRON HCL 4 MG/2ML IJ SOLN
INTRAMUSCULAR | Status: AC
  Filled 2019-01-27: qty 2

## 2019-01-27 MED ORDER — IOHEXOL 300 MG/ML  SOLN
100.0000 mL | Freq: Once | INTRAMUSCULAR | Status: AC | PRN
  Administered 2019-01-27: 100 mL via INTRAVENOUS

## 2019-01-27 MED ORDER — OXYCODONE-ACETAMINOPHEN 5-325 MG PO TABS: 1.0000 | ORAL_TABLET | Freq: Three times a day (TID) | ORAL | 0 refills | Status: DC | PRN

## 2019-01-27 MED ORDER — ONDANSETRON 4 MG PO TBDP: 4.0000 mg | ORAL_TABLET | Freq: Three times a day (TID) | ORAL | 0 refills | Status: DC | PRN

## 2019-01-27 NOTE — ED Provider Notes (Signed)
[facility] Emergency Department Provider Note       Time seen: ----------------------------------------- 9:45 AM on 01/27/2019 -----------------------------------------   I have reviewed the triage vital signs and the nursing notes.  HISTORY   Chief Complaint Abdominal Pain    HPI Curtis Burns is a 23 y.o. male with a history of asthma, seizures who presents to the ED for right lower quadrant abdominal pain with nausea, vomiting and diarrhea for 3 days.  Patient states he is never had pain like this before, nothing makes it better or worse.  Patient presents diaphoretic and severe pain.  Past Medical History:  Diagnosis Date  . Allergic rhinitis   . Asthma   . Overweight   . Seizures (HCC)     There are no active problems to display for this patient.   No past surgical history on file.  Allergies Prednisone  Social History Social History   Tobacco Use  . Smoking status: Current Every Day Smoker    Packs/day: 0.50    Types: Cigarettes  . Smokeless tobacco: Never Used  Substance Use Topics  . Alcohol use: No  . Drug use: No   Review of Systems Constitutional: Negative for fever. Cardiovascular: Negative for chest pain. Respiratory: Negative for shortness of breath. Gastrointestinal: Positive for abdominal pain, vomiting and diarrhea Musculoskeletal: Negative for back pain. Skin: Positive for diaphoresis Neurological: Negative for headaches, focal weakness or numbness.  All systems negative/normal/unremarkable except as stated in the HPI  ____________________________________________   PHYSICAL EXAM:  VITAL SIGNS: ED Triage Vitals  Enc Vitals Group     BP --      Pulse Rate 01/27/19 0944 91     Resp 01/27/19 0944 16     Temp 01/27/19 0944 98.8 F (37.1 C)     Temp Source 01/27/19 0944 Oral     SpO2 01/27/19 0944 94 %     Weight 01/27/19 0945 220 lb (99.8 kg)     Height 01/27/19 0945 6\' 1"  (1.854 m)     Head  Circumference --      Peak Flow --      Pain Score 01/27/19 0944 10     Pain Loc --      Pain Edu? --      Excl. in GC? --     Constitutional: Alert and oriented.  Moderate distress from pain Eyes: Conjunctivae are normal. Normal extraocular movements. Cardiovascular: Normal rate, regular rhythm. No murmurs, rubs, or gallops. Respiratory: Normal respiratory effort without tachypnea nor retractions. Breath sounds are clear and equal bilaterally. No wheezes/rales/rhonchi. Gastrointestinal: Right lower quadrant tenderness, some guarding is noted, hypoactive bowel sounds Musculoskeletal: Nontender with normal range of motion in extremities. No lower extremity tenderness nor edema. Neurologic:  Normal speech and language. No gross focal neurologic deficits are appreciated.  Skin: Diaphoresis is noted Psychiatric: Mood and affect are normal. Speech and behavior are normal.  ____________________________________________  ED COURSE:  As part of my medical decision making, I reviewed the following data within the electronic MEDICAL RECORD NUMBER History obtained from family if available, nursing notes, old chart and ekg, as well as notes from prior ED visits. Patient presented for abdominal pain with vomiting and diarrhea, we will assess with labs and imaging as indicated at this time.   Procedures  Curtis Burns was evaluated in Emergency Department on 01/27/2019 for the symptoms described in the history of present illness. He was evaluated in the context of the global COVID-19 pandemic, which  necessitated consideration that the patient might be at risk for infection with the SARS-CoV-2 virus that causes COVID-19. Institutional protocols and algorithms that pertain to the evaluation of patients at risk for COVID-19 are in a state of rapid change based on information released by regulatory bodies including the CDC and federal and state organizations. These policies and algorithms were followed  during the patient's care in the ED.  ____________________________________________   LABS (pertinent positives/negatives)  Labs Reviewed  CBC WITH DIFFERENTIAL/PLATELET - Abnormal; Notable for the following components:      Result Value   WBC 11.1 (*)    Neutro Abs 8.9 (*)    All other components within normal limits  COMPREHENSIVE METABOLIC PANEL - Abnormal; Notable for the following components:   Glucose, Bld 116 (*)    All other components within normal limits  LIPASE, BLOOD  URINALYSIS, COMPLETE (UACMP) WITH MICROSCOPIC    RADIOLOGY Images were viewed by me  CT the abdomen pelvis with contrast IMPRESSION: 1. 2 mm calculus at the right ureterovesical junction with moderate hydronephrosis and ureterectasis on the right.  2. Normal appearing appendix. No bowel obstruction. No abscess in the abdomen or pelvis.  3.  Minimal ventral hernia containing only fat. ____________________________________________   DIFFERENTIAL DIAGNOSIS   Appendicitis, renal colic, UTI, pyelonephritis, gastroenteritis, dehydration  FINAL ASSESSMENT AND PLAN  Renal colic   Plan: The patient had presented for right-sided abdominal pain with vomiting and diarrhea. Patient's labs were unremarkable. Patient's imaging did reveal a distal ureteral stone on the right that was 2 mm.  Pain was completely resolved with Toradol.  He should be able to pass the stone.  He be discharged with pain medicine and antiemetics and is cleared for outpatient follow-up.   Laurence Aly, MD    Note: This note was generated in part or whole with voice recognition software. Voice recognition is usually quite accurate but there are transcription errors that can and very often do occur. I apologize for any typographical errors that were not detected and corrected.     Earleen Newport, MD 01/27/19 1252

## 2019-01-27 NOTE — ED Triage Notes (Signed)
Patient presents to the ED with right lower quadrant abdominal pain with nausea, vomiting and diarrhea x 3 days.  Patient is very diaphoretic, appears uncomfortable and is actively vomiting during triage.  MD notified.

## 2019-01-27 NOTE — ED Notes (Signed)
Pt verbalized understanding of D/C instructions. No questions from patient. Instructed to follow up with Dr. Noel Journey. Stable at departure

## 2019-01-27 NOTE — ED Notes (Signed)
Patient states pain is now only in the right testicle.  MD notified.

## 2019-03-22 ENCOUNTER — Encounter: Payer: Self-pay | Admitting: Emergency Medicine

## 2019-03-22 ENCOUNTER — Ambulatory Visit
Admission: EM | Admit: 2019-03-22 | Discharge: 2019-03-22 | Disposition: A | Discharge status: Home / Self Care | Payer: BC Managed Care – PPO | Attending: Urgent Care | Admitting: Urgent Care

## 2019-03-22 ENCOUNTER — Other Ambulatory Visit: Payer: Self-pay

## 2019-03-22 DIAGNOSIS — L03213 Periorbital cellulitis: Secondary | ICD-10-CM

## 2019-03-22 MED ORDER — SULFAMETHOXAZOLE-TRIMETHOPRIM 800-160 MG PO TABS: 1.0000 | ORAL_TABLET | Freq: Two times a day (BID) | ORAL | 0 refills | Status: AC

## 2019-03-22 MED ORDER — AMOXICILLIN-POT CLAVULANATE 875-125 MG PO TABS: 1.0000 | ORAL_TABLET | Freq: Two times a day (BID) | ORAL | 0 refills | Status: AC

## 2019-03-22 NOTE — ED Triage Notes (Signed)
Patient states she woke up Saturday morning with left eye pain, swelling and drainage. Patient has tried OTC eye drops and medication for a sty.

## 2019-03-22 NOTE — Discharge Instructions (Addendum)
It was very nice seeing you today in clinic. Thank you for entrusting me with your care.   Please utilize the medications that we discussed. Your prescriptions have been called in to your pharmacy. Cool compresses may help soothe your eye.   Make arrangements to follow up with your regular doctor in 1 week for re-evaluation if not improving.  If your symptoms/condition worsens, please seek follow up care either here or in the ER. Please remember, our [facility] providers are "right here with you" when you need [place].   Again, it was my pleasure to take care of you today. Thank you for choosing our clinic. I hope that you start to feel better quickly.   Honor Loh, MSN, APRN, FNP-C, CEN Advanced Practice Provider [facility]

## 2019-03-22 NOTE — ED Provider Notes (Signed)
Curtis Burns, Curtis Burns   Name: Curtis HessJesse Douglas Burns DOB: 11/21/1995 MRN: [id number] CSN: [id number] PCP: Curtis Sizerrissman, Mark A, MD  Arrival date and time:  03/22/19 0837  Chief Complaint:  Eye Problem   NOTE: Prior to seeing the patient today, I have reviewed the triage nursing documentation and vital signs. Clinical staff has updated patient's PMH/PSHx, current medication list, and drug allergies/intolerances to ensure comprehensive history available to assist in medical decision making.   History:   HPI: Curtis Burns is Burns 23 y.o. male who presents today with complaints of pain around his LEFT eye that started on Saturday morning (03/19/2019). Patient notes that he woke up with his eye being swollen. Since onset, swelling and erythema to LEFT infraorbital area has increased. He has had some concurrent (mild) upper respiratory symptoms over the last couple of weeks. He complains of Burns headache on the LEFT side today. He has not had any fevers. Patient denies visual changes. He has not experienced any excessive tearing, however notes that he we makes up with significant yellow exudative crusting. He does not wear contact lenses. Patient has tried any over the counter eye drops and "stye medication" to help reduce/relieve his current symptoms.   Past Medical History:  Diagnosis Date   Allergic rhinitis    Asthma    Overweight    Seizures (HCC)    Testicular cyst     History reviewed. No pertinent surgical history.  Family History  Problem Relation Age of Onset   Kidney Stones Father    Kidney Stones Sister     Social History   Tobacco Use   Smoking status: Current Every Day Smoker    Packs/day: 0.50    Types: Cigarettes   Smokeless tobacco: Never Used  Substance Use Topics   Alcohol use: Yes   Drug use: No    There are no active problems to display for this patient.   Home Medications:    No outpatient medications have been marked as taking for the 03/22/19  encounter [facility](Hospital Encounter).    Allergies:   Prednisone  Review of Systems (ROS): Review of Systems  Constitutional: Negative for chills and fever.  HENT: Positive for congestion (mild), sinus pressure and sinus pain. Negative for rhinorrhea, sneezing and sore throat.   Eyes: Positive for pain, discharge, redness and itching. Negative for photophobia and visual disturbance.  Respiratory: Negative for cough and shortness of breath.   Cardiovascular: Negative for chest pain and palpitations.  Gastrointestinal: Negative for diarrhea, nausea and vomiting.  Neurological: Positive for headaches. Negative for dizziness, syncope, weakness and numbness.  All other systems reviewed and are negative.    Vital Signs: Today's Vitals   03/22/19 0849 03/22/19 0853 03/22/19 0921  BP:  115/74   Pulse:  (!) 53   Resp:  18   Temp:  98.6 F (37 C)   TempSrc:  Oral   SpO2:  100%   Weight: 210 lb (95.3 kg)    Height: 6\' 1"  (1.854 m)    PainSc: 4   4     Physical Exam: Physical Exam  Constitutional: He is oriented to person, place, and time and well-developed, well-nourished, and in no distress.  HENT:  Head: Normocephalic and atraumatic. Head is with left periorbital erythema.    Mouth/Throat: Mucous membranes are normal.  Eyes: Pupils are equal, round, and reactive to light. Conjunctivae and EOM are normal. Left eye exhibits exudate (reported; none observed).  Neck: Normal range of motion. Neck supple. No  tracheal deviation present.  Cardiovascular: Normal rate.  Pulmonary/Chest: Effort normal. No respiratory distress.  Neurological: He is alert and oriented to person, place, and time. Gait normal.  Skin: Skin is warm and dry. No rash noted.  Psychiatric: Mood, memory, affect and judgment normal.  Nursing note and vitals reviewed.   Urgent Care Treatments / Results:   LABS: PLEASE NOTE: all labs that were ordered this encounter are listed, however only abnormal results are  displayed. Labs Reviewed - No data to display  EKG: -None  RADIOLOGY: No results found.  PROCEDURES: Procedures  MEDICATIONS RECEIVED THIS VISIT: Medications - No data to display  PERTINENT CLINICAL COURSE NOTES/UPDATES:   Initial Impression / Assessment and Plan / Urgent Care Course:  Pertinent labs & imaging results that were available during my care of the patient were personally reviewed by me and considered in my medical decision making (see lab/imaging section of note for values and interpretations).  Curtis Burns is Burns 23 y.o. male who presents [facility] today with complaints of Eye Problem   Patient is well appearing overall in clinic today. He does not appear to be in any acute distress. Presenting symptoms (see HPI) and exam as documented above. Symptoms and exam consistent with early preseptal cellulitis. Patient has been treating eyes at home with saline gtts and "stye medication". Symptoms have progressed since Saturday, with erythema, tenderness, and swelling extending under the eye. Patient denies any visual changes. Discussed cool compresses to help soothe the eye. Will treat with 5 day courses of SMZ-TMP and Augmentin. Patient advised that any increase in pain or changes to his vision will need further evaluation by ophthalmology. May use Tylenol and/or Ibuprofen as needed for pain. Patient to return call to the clinic with any concerns or questions.    Discussed follow up with primary care physician in 1 week for re-evaluation. I have reviewed the follow up and strict return precautions for any new or worsening symptoms. Patient is aware of symptoms that would be deemed urgent/emergent, and would thus require further evaluation either here or in the emergency department. At the time of discharge, he verbalized understanding and consent with the discharge plan as it was reviewed with him. All questions were fielded by provider and/or clinic staff prior to  patient discharge.    Final Clinical Impressions / Urgent Care Diagnoses:   Final diagnoses:  Preseptal cellulitis of left eye    New Prescriptions:  C[facility]is [facility] Controlled Substance Registry consulted? Not Applicable  Meds ordered this encounter  Medications   sulfamethoxazole-trimethoprim (BACTRIM DS) 800-160 MG tablet    Sig: Take 1 tablet by mouth 2 (two) times daily for 5 days.    Dispense:  10 tablet    Refill:  0   amoxicillin-clavulanate (AUGMENTIN) 875-125 MG tablet    Sig: Take 1 tablet by mouth 2 (two) times daily for 5 days.    Dispense:  10 tablet    Refill:  0    Recommended Follow up Care:  Patient encouraged to follow up with the following provider within the specified time frame, or sooner as dictated by the severity of his symptoms. As always, he was instructed that for any urgent/emergent care needs, he should seek care either here or in the emergency department for more immediate evaluation.  Follow-up Information    Crissman, Jeannette How, MD In 1 week.   Specialty: Family Medicine Why: General reassessment of symptoms if not improving Contact information: [address]  [place] [address] [phone]         NOTE: This note was prepared using Dragon dictation software along with smaller phrase technology. Despite my best ability to proofread, there is the potential that transcriptional errors may still occur from this process, and are completely unintentional.     Verlee Monte, NP 03/22/19 [phone]

## 2019-07-01 ENCOUNTER — Other Ambulatory Visit: Payer: BC Managed Care – PPO

## 2019-08-30 DIAGNOSIS — R55 Syncope and collapse: Secondary | ICD-10-CM | POA: Insufficient documentation

## 2019-08-30 DIAGNOSIS — R569 Unspecified convulsions: Secondary | ICD-10-CM | POA: Insufficient documentation

## 2019-09-06 ENCOUNTER — Other Ambulatory Visit: Payer: Self-pay | Admitting: Neurology

## 2019-09-06 DIAGNOSIS — R202 Paresthesia of skin: Secondary | ICD-10-CM

## 2019-09-06 DIAGNOSIS — R200 Anesthesia of skin: Secondary | ICD-10-CM

## 2019-09-06 DIAGNOSIS — R292 Abnormal reflex: Secondary | ICD-10-CM

## 2019-09-16 ENCOUNTER — Ambulatory Visit: Payer: BC Managed Care – PPO

## 2020-07-05 ENCOUNTER — Telehealth: Payer: Self-pay

## 2020-07-05 NOTE — Telephone Encounter (Signed)
Copied from CRM [id number]. Topic: Quick Communication - See Telephone Encounter >> Jul 05, 2020 11:51 AM Aretta Nip wrote: CRM for notification. See Telephone encounter for: 07/05/20. Please FU with pt re Sch appt for evaluation of seizures that he needs for a court dates for jury duty 1/15 . Will be request that due to stress of being around other people causes stress  Pt last seen 04/03/19 only has ever seen Crissman. Best # for pt for cb is [phone] Told pt may be Monday.

## 2020-07-05 NOTE — Telephone Encounter (Signed)
Will pt need apt? Can this be done?

## 2020-07-05 NOTE — Telephone Encounter (Signed)
Yes, since he has only seen Dr. Dossie Arbour he would need to see one of [place].

## 2020-07-09 NOTE — Telephone Encounter (Signed)
Pt scheduled for 07/11/2020 at 1:00.Pt verbalized understanding.

## 2020-07-11 ENCOUNTER — Ambulatory Visit (INDEPENDENT_AMBULATORY_CARE_PROVIDER_SITE_OTHER): Payer: 59 | Admitting: Family Medicine

## 2020-07-11 ENCOUNTER — Other Ambulatory Visit: Payer: Self-pay

## 2020-07-11 ENCOUNTER — Encounter: Payer: Self-pay | Admitting: Family Medicine

## 2020-07-11 VITALS — BP 109/61 | HR 51 | Temp 98.7°F | Ht 73.6 in | Wt 229.6 lb

## 2020-07-11 DIAGNOSIS — R55 Syncope and collapse: Secondary | ICD-10-CM

## 2020-07-11 DIAGNOSIS — R569 Unspecified convulsions: Secondary | ICD-10-CM | POA: Diagnosis not present

## 2020-07-11 NOTE — Patient Instructions (Signed)
It was great to see you!  Our plans for today:  - I have provided a letter for jury duty.  - Call Dr. Margaretmary Eddy office for follow up appointment.  - I have referred you to psychiatry, someone will call you with this appointment.   Take care and seek immediate care sooner if you develop any concerns.   Dr. Linwood Dibbles
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
## 2020-07-11 NOTE — Assessment & Plan Note (Addendum)
Compliant with lamotrigine but still with breakthrough seizures, triggered by stress/pain. Recommended f/u with Neuro as missed last f/u. Referral for psych placed based on last neuro recs. Letter for jury duty provided until w/u complete or symptoms stabilized.

## 2021-01-29 ENCOUNTER — Encounter: Payer: Self-pay | Admitting: Emergency Medicine

## 2021-01-29 ENCOUNTER — Other Ambulatory Visit: Payer: Self-pay

## 2021-01-29 ENCOUNTER — Ambulatory Visit
Admission: EM | Admit: 2021-01-29 | Discharge: 2021-01-29 | Disposition: A | Discharge status: Home / Self Care | Payer: 59 | Attending: Emergency Medicine | Admitting: Emergency Medicine

## 2021-01-29 DIAGNOSIS — L237 Allergic contact dermatitis due to plants, except food: Secondary | ICD-10-CM

## 2021-01-29 MED ORDER — TRIAMCINOLONE ACETONIDE 0.1 % EX CREA: 1.0000 "application " | TOPICAL_CREAM | Freq: Two times a day (BID) | CUTANEOUS | 0 refills | Status: DC

## 2021-01-29 NOTE — ED Triage Notes (Signed)
Pt presents today with c/o of rash to BLE and face x 3 days.

## 2021-01-29 NOTE — Discharge Instructions (Addendum)
Take/use all medications as prescribed. Increase fluid intake. You may take Benadryl 25-50mg every 4-6 hours as needed OR Zyrtec 10mg daily for itching/inflammation. You may also take over the counter Famotidine 20mg once daily to help with itching/inflammation. You may apply ice wrapped in a towel to affected area 3-5 times daily for 10-15 minute intervals. See your PCP if rash does not improve in 5-6 days. See your PCP or return to clinic sooner if rash worsens or you develop fever.  

## 2021-01-29 NOTE — ED Provider Notes (Signed)
AndName: Curtis Burns Address: [address] [place] [address] MRN: [id number] DOB: 03-07-1996 Age: 25 y.o. Gender: male Encounter Date: 01/29/2021 Primary Provider: Loura Pardon, MD  This is a very pleasant 25 y.o. male with a 3 day hx of erythematous rash, some oozing, moderate to severe itching, located primarily on bilateral lower extremities neck.  Patient has been in wooded areas, with possible poison oak/poison ivy contact. No fevers, No other concerns.   The following portions of the patient's history were reviewed and updated in Epic as appropriate: allergies, current medications, past medical history, past social history, past surgical history and problem list.   Vitals:   01/29/21 1043  BP: (!) 110/58  Pulse: 63  Resp: 18  Temp: 98.5 F (36.9 C)  SpO2: 99%     General: Appears well-developed and well-nourished. No acute distress.  HEENT Head: Normocephalic and atraumatic.  Facial muscles are symmetric.  Eyes: Conjunctivae and EOM are normal. No eye drainage or scleral icterus bilaterally.  Ears: Hearing grossly intact.  Cardiovascular: Normal rate. Pulm/Chest: No respiratory distress.  Patient speaking in full unlabored sentences.  No stridor.  Musculoskeletal: No joint deformity, normal range of motion.  Skin: Skin is warm and dry. Erythematous papules organized in patches occasional vesicles, mild excoriation, diffusely on bilateral lower extremities and neck Psychiatric: Normal mood, affect, behavior, and thought content.   MDM: Due to symptoms along with assessment findings likely contact dermatitis secondary to poison oak exposure.  Which I will advise treatment with triamcinolone along with OTC remedies as discussed and outlined in AVS.  I do not suspect any underlying bacterial etiology for the patient's current symptoms, as such I will not recommend antibiotic use at this time.  Explained strict return/ED precautions and explained to follow up with PCP  within the next 1 week for recheck.  Patient verbalized understanding and agreed with treatment plan.  Patient stable upon discharge.  1. Poison oak dermatitis   Plan:    Discharge Instructions      Take/use all medications as prescribed. Increase fluid intake. You may take Benadryl 25-50mg  every 4-6 hours as needed OR Zyrtec 10mg  daily for itching/inflammation. You may also take over the counter Famotidine 20mg  once daily to help with itching/inflammation. You may apply ice wrapped in a towel to affected area 3-5 times daily for 10-15 minute intervals. See your PCP if rash does not improve in 5-6 days. See your PCP or return to clinic sooner if rash worsens or you develop fever.         , FNP-C 01/29/21  This note was partially made with the aid of speech-to-text dictation; typographical errors are not intentional.    Amalia Greenhouse, FNP 01/29/21 1107

## 2021-01-30 ENCOUNTER — Telehealth: Payer: Self-pay

## 2021-01-30 MED ORDER — HYDROCORTISONE 2.5 % EX LOTN: TOPICAL_LOTION | Freq: Two times a day (BID) | CUTANEOUS | 0 refills | Status: DC

## 2021-03-05 ENCOUNTER — Ambulatory Visit
Admission: RE | Admit: 2021-03-05 | Discharge: 2021-03-05 | Disposition: A | Discharge status: Home / Self Care | Payer: 59 | Source: Ambulatory Visit | Attending: Emergency Medicine | Admitting: Emergency Medicine

## 2021-03-05 ENCOUNTER — Other Ambulatory Visit: Payer: Self-pay

## 2021-03-05 VITALS — BP 118/61 | HR 55 | Temp 98.6°F | Resp 18

## 2021-03-05 DIAGNOSIS — R197 Diarrhea, unspecified: Secondary | ICD-10-CM | POA: Diagnosis not present

## 2021-03-05 DIAGNOSIS — R1012 Left upper quadrant pain: Secondary | ICD-10-CM | POA: Diagnosis not present

## 2021-03-05 DIAGNOSIS — R112 Nausea with vomiting, unspecified: Secondary | ICD-10-CM

## 2021-03-05 MED ORDER — ONDANSETRON 4 MG PO TBDP: 4.0000 mg | ORAL_TABLET | Freq: Three times a day (TID) | ORAL | 0 refills | Status: DC | PRN

## 2021-03-05 NOTE — ED Triage Notes (Signed)
Pt here for vomiting in the early morning after waking every day for the last 3-4 years. States that when it occurs, his LUQ is painful and his abdomen becomes distended. Denies any epigastric burning or history of GERD. Emesis is clear and small amounts.

## 2021-03-05 NOTE — Discharge Instructions (Addendum)
Take the antinausea medication as directed.    Keep yourself hydrated with clear liquids, such as water, Gatorade, Pedialyte, Sprite, or ginger ale.    Go to the emergency department if you have acute worsening symptoms.    Follow up with your primary care provider.        

## 2021-03-05 NOTE — ED Provider Notes (Signed)
Renaldo Fiddler    CSN: [id number] Arrival date & time: 03/05/21  1506      History   Chief Complaint Chief Complaint  Patient presents with   Abdominal Pain   Emesis   Bloated    HPI Curtis Burns is a 25 y.o. male.  Patient presents with 3-year history of nausea, vomiting, diarrhea in the mornings.  During the episodes he has mild left upper quadrant abdominal pain.  Last episode occurred this morning.  He denies fever, chills, constipation, blood in his stool, dysuria, hematuria, or other symptoms.  Last bowel movement this morning.  No vomiting or diarrhea since early morning.  Patient has not seen his PCP for this issue.  His medical history includes seizures and asthma.  The history is provided by the patient and medical records.   Past Medical History:  Diagnosis Date   Allergic rhinitis    Asthma    Overweight    Seizures (HCC)    Testicular cyst     Patient Active Problem List   Diagnosis Date Noted   Syncopal seizure (HCC) 08/30/2019    History reviewed. No pertinent surgical history.     Home Medications    Prior to Admission medications   Medication Sig Start Date End Date Taking? Authorizing Provider  ondansetron (ZOFRAN ODT) 4 MG disintegrating tablet Take 1 tablet (4 mg total) by mouth every 8 (eight) hours as needed for nausea or vomiting. 03/05/21  Yes Mickie Bail, NP  hydrocortisone 2.5 % lotion Apply topically 2 (two) times daily. Apply lightly on rash 2 times a day x 7-10days. 01/30/21   Rodriguez-Southworth, Nettie Elm, PA-C  LamoTRIgine 100 MG TB24 24 hour tablet Take 1 tablet by mouth daily. 09/21/19 09/20/20  [provider]  triamcinolone cream (KENALOG) 0.1 % Apply 1 application topically 2 (two) times daily. 01/29/21   Boddu, Belenda Cruise, FNP  ipratropium (ATROVENT) 0.02 % nebulizer solution Take 2.5 mLs (0.5 mg total) by nebulization 4 (four) times daily. 06/13/18 03/22/19  Tommi Rumps, PA-C    Family History Family  History  Problem Relation Age of Onset   Kidney Stones Father    Kidney Stones Sister    Heart disease Paternal Grandfather     Social History Social History   Tobacco Use   Smoking status: Every Day    Packs/day: 0.50    Types: Cigarettes   Smokeless tobacco: Never  Vaping Use   Vaping Use: Never used  Substance Use Topics   Alcohol use: Yes    Comment: on occasion   Drug use: No     Allergies   Prednisone   Review of Systems Review of Systems  Constitutional:  Negative for chills and fever.  Respiratory:  Negative for cough and shortness of breath.   Cardiovascular:  Negative for chest pain and palpitations.  Gastrointestinal:  Positive for abdominal pain, diarrhea and nausea. Negative for blood in stool, constipation and vomiting.  Genitourinary:  Negative for dysuria and hematuria.  Skin:  Negative for color change and rash.  All other systems reviewed and are negative.   Physical Exam Triage Vital Signs ED Triage Vitals  Enc Vitals Group     BP      Pulse      Resp      Temp      Temp src      SpO2      Weight      Height      Head Circumference  Peak Flow      Pain Score      Pain Loc      Pain Edu?      Excl. in GC?    No data found.  Updated Vital Signs BP 118/61 (BP Location: Left Arm)   Pulse (!) 55   Temp 98.6 F (37 C) (Oral)   Resp 18   SpO2 97%   Visual Acuity Right Eye Distance:   Left Eye Distance:   Bilateral Distance:    Right Eye Near:   Left Eye Near:    Bilateral Near:     Physical Exam Vitals and nursing note reviewed.  Constitutional:      General: He is not in acute distress.    Appearance: He is well-developed. He is not ill-appearing.  HENT:     Head: Normocephalic and atraumatic.     Mouth/Throat:     Mouth: Mucous membranes are moist.  Eyes:     Conjunctiva/sclera: Conjunctivae normal.  Cardiovascular:     Rate and Rhythm: Normal rate and regular rhythm.     Heart sounds: Normal heart sounds.   Pulmonary:     Effort: Pulmonary effort is normal. No respiratory distress.     Breath sounds: Normal breath sounds.  Abdominal:     General: Bowel sounds are normal. There is no distension.     Palpations: Abdomen is soft.     Tenderness: There is no abdominal tenderness. There is no guarding or rebound.  Musculoskeletal:     Cervical back: Neck supple.  Skin:    General: Skin is warm and dry.  Neurological:     General: No focal deficit present.     Mental Status: He is alert and oriented to person, place, and time.     Gait: Gait normal.  Psychiatric:        Mood and Affect: Mood normal.        Behavior: Behavior normal.     UC Treatments / Results  Labs (all labs ordered are listed, but only abnormal results are displayed) Labs Reviewed - No data to display  EKG   Radiology No results found.  Procedures Procedures (including critical care time)  Medications Ordered in UC Medications - No data to display  Initial Impression / Assessment and Plan / UC Course  I have reviewed the triage vital signs and the nursing notes.  Pertinent labs & imaging results that were available during my care of the patient were reviewed by me and considered in my medical decision making (see chart for details).  Left upper quadrant abdominal pain, nausea, vomiting, diarrhea.  Patient is well-appearing and his exam is reassuring.  His abdomen is soft and nontender.  Treating with Zofran.  Instructed patient to keep himself hydrated with clear liquids.  ED precautions discussed.  Instructed patient to schedule an appointment with his PCP soon as possible.  Education provided on abdominal pain, nausea and vomiting, diarrhea.  Patient agrees to plan of care.   Final Clinical Impressions(s) / UC Diagnoses   Final diagnoses:  Left upper quadrant abdominal pain  Nausea vomiting and diarrhea     Discharge Instructions      Take the antinausea medication as directed.    Keep yourself  hydrated with clear liquids, such as water, Gatorade, Pedialyte, Sprite, or ginger ale.    Go to the emergency department if you have acute worsening symptoms.    Follow up with your primary care provider.  ED Prescriptions     Medication Sig Dispense Auth. Provider   ondansetron (ZOFRAN ODT) 4 MG disintegrating tablet Take 1 tablet (4 mg total) by mouth every 8 (eight) hours as needed for nausea or vomiting. 20 tablet Mickie Bail, NP      PDMP not reviewed this encounter.   Mickie Bail, NP 03/05/21 1540

## 2021-03-07 ENCOUNTER — Ambulatory Visit: Payer: 59 | Admitting: Nurse Practitioner

## 2021-03-07 NOTE — Progress Notes (Deleted)
There were no vitals taken for this visit.   Subjective:    Patient ID: Lawrence Mitch, male    DOB: 1996/02/09, 25 y.o.   MRN: [id number]  HPI: Sopheap Boehle is a 25 y.o. male  No chief complaint on file.   Relevant past medical, surgical, family and social history reviewed and updated as indicated. Interim medical history since our last visit reviewed. Allergies and medications reviewed and updated.  Review of Systems  Per HPI unless specifically indicated above     Objective:    There were no vitals taken for this visit.  Wt Readings from Last 3 Encounters:  07/11/20 229 lb 9.6 oz (104.1 kg)  03/22/19 210 lb (95.3 kg)  01/27/19 220 lb (99.8 kg)    Physical Exam  Results for orders placed or performed during the hospital encounter of 01/27/19  CBC with Differential  Result Value Ref Range   WBC 11.1 (H) 4.0 - 10.5 K/uL   RBC 5.02 4.22 - 5.81 MIL/uL   Hemoglobin 14.9 13.0 - 17.0 g/dL   HCT 81.8 29.9 - 37.1 %   MCV 90.6 80.0 - 100.0 fL   MCH 29.7 26.0 - 34.0 pg   MCHC 32.7 30.0 - 36.0 g/dL   RDW 69.6 78.9 - 38.1 %   Platelets 290 150 - 400 K/uL   nRBC 0.0 0.0 - 0.2 %   Neutrophils Relative % 80 %   Neutro Abs 8.9 (H) 1.7 - 7.7 K/uL   Lymphocytes Relative 13 %   Lymphs Abs 1.5 0.7 - 4.0 K/uL   Monocytes Relative 6 %   Monocytes Absolute 0.6 0.1 - 1.0 K/uL   Eosinophils Relative 1 %   Eosinophils Absolute 0.1 0.0 - 0.5 K/uL   Basophils Relative 0 %   Basophils Absolute 0.0 0.0 - 0.1 K/uL   Immature Granulocytes 0 %   Abs Immature Granulocytes 0.03 0.00 - 0.07 K/uL  Comprehensive metabolic panel  Result Value Ref Range   Sodium 141 135 - 145 mmol/L   Potassium 3.5 3.5 - 5.1 mmol/L   Chloride 104 98 - 111 mmol/L   CO2 27 22 - 32 mmol/L   Glucose, Bld 116 (H) 70 - 99 mg/dL   BUN 16 6 - 20 mg/dL   Creatinine, Ser 0.17 0.61 - 1.24 mg/dL   Calcium 9.6 8.9 - 51.0 mg/dL   Total Protein 7.7 6.5 - 8.1 g/dL   Albumin 4.7 3.5 - 5.0 g/dL   AST 22 15  - 41 U/L   ALT 31 0 - 44 U/L   Alkaline Phosphatase 56 38 - 126 U/L   Total Bilirubin 0.9 0.3 - 1.2 mg/dL   GFR calc non Af Amer >60 >60 mL/min   GFR calc Af Amer >60 >60 mL/min   Anion gap 10 5 - 15  Lipase, blood  Result Value Ref Range   Lipase 33 11 - 51 U/L  Urinalysis, Complete w Microscopic  Result Value Ref Range   Color, Urine YELLOW (A) YELLOW   APPearance CLEAR (A) CLEAR   Specific Gravity, Urine >1.046 (H) 1.005 - 1.030   pH 5.0 5.0 - 8.0   Glucose, UA NEGATIVE NEGATIVE mg/dL   Hgb urine dipstick MODERATE (A) NEGATIVE   Bilirubin Urine NEGATIVE NEGATIVE   Ketones, ur NEGATIVE NEGATIVE mg/dL   Protein, ur NEGATIVE NEGATIVE mg/dL   Nitrite NEGATIVE NEGATIVE   Leukocytes,Ua NEGATIVE NEGATIVE   RBC / HPF 0-5 0 - 5 RBC/hpf  WBC, UA 0-5 0 - 5 WBC/hpf   Bacteria, UA NONE SEEN NONE SEEN   Squamous Epithelial / LPF 0-5 0 - 5   Mucus PRESENT       Assessment & Plan:   Problem List Items Addressed This Visit   None    Follow up plan: No follow-ups on file.

## 2021-03-12 ENCOUNTER — Encounter: Payer: Self-pay | Admitting: Nurse Practitioner

## 2021-03-12 ENCOUNTER — Other Ambulatory Visit: Payer: Self-pay

## 2021-03-12 ENCOUNTER — Ambulatory Visit (INDEPENDENT_AMBULATORY_CARE_PROVIDER_SITE_OTHER): Payer: 59 | Admitting: Nurse Practitioner

## 2021-03-12 VITALS — BP 102/65 | HR 71 | Temp 98.6°F | Wt 240.2 lb

## 2021-03-12 DIAGNOSIS — R1032 Left lower quadrant pain: Secondary | ICD-10-CM

## 2021-03-12 MED ORDER — POLYETHYLENE GLYCOL 3350 17 GM/SCOOP PO POWD: 17.0000 g | Freq: Every day | ORAL | 1 refills | Status: DC

## 2021-03-12 NOTE — Assessment & Plan Note (Addendum)
Chronic ongoing. Has noted intermittent abdominal pain for the last several years. CT abdomen reviewed from 2020 with no cause identified. No red flags on exam. Has endorsed intermittent constipation and feeling like he needs to have a bowel movement but unable x2 today. Will have him start miralax daily. Will also place referral to GI for further evaluation. He states he is not good with needles and declines to have any labs drawn today.

## 2021-03-12 NOTE — Progress Notes (Signed)
Established Patient Office Visit  Subjective:  Patient ID: Curtis Burns, male    DOB: 1995/07/21  Age: 25 y.o. MRN: [id number]  CC:  Chief Complaint  Patient presents with   Referral    Patient is here to discuss a GI referral. Patient states this morning when he woke up he had the urgent to have a BM and when he went to the bathroom and nothing is coming out. Patient states he has been dealing with GI issues for about 4 years of having pain of lower abdomen and states he has never seen GI and would like to discuss a referral. Patient states he has been having issues recently with bloating and denies having constipation issues.     HPI Curtis Burns presents for chronic abdominal pain  ABDOMINAL PAIN  Duration: years Onset: gradual Severity: 8/10 Quality: aching Location:  peri-umbilical, LLQ, and RLQ  Episode duration:  Radiation: no Frequency: intermittent Alleviating factors: nothing Aggravating factors: bending forward Status: fluctuating Treatments attempted: none Fever: no Nausea: yes Vomiting: yes Weight loss: no Decreased appetite:  sometimes Diarrhea:  sometimes Constipation:  sometimes Blood in stool: no Heartburn: no Jaundice: no Rash: no Dysuria/urinary frequency: no Hematuria: no History of sexually transmitted disease: no Recurrent NSAID use: no   Past Medical History:  Diagnosis Date   Allergic rhinitis    Asthma    Overweight    Seizures (HCC)    Testicular cyst     History reviewed. No pertinent surgical history.  Family History  Problem Relation Age of Onset   Kidney Stones Father    Kidney Stones Sister    Heart disease Paternal Grandfather     Social History   Socioeconomic History   Marital status: Single    Spouse name: Not on file   Number of children: Not on file   Years of education: Not on file   Highest education level: Not on file  Occupational History   Not on file  Tobacco Use   Smoking status:  Every Day    Packs/day: 0.50    Types: Cigarettes   Smokeless tobacco: Never  Vaping Use   Vaping Use: Never used  Substance and Sexual Activity   Alcohol use: Yes    Comment: on occasion   Drug use: No   Sexual activity: Yes  Other Topics Concern   Not on file  Social History Narrative   Not on file   Social Determinants of Health   Financial Resource Strain: Not on file  Food Insecurity: Not on file  Transportation Needs: Not on file  Physical Activity: Not on file  Stress: Not on file  Social Connections: Not on file  Intimate Partner Violence: Not on file    Outpatient Medications Prior to Visit  Medication Sig Dispense Refill   hydrocortisone 2.5 % lotion Apply topically 2 (two) times daily. Apply lightly on rash 2 times a day x 7-10days. 59 mL 0   LamoTRIgine 100 MG TB24 24 hour tablet Take 1 tablet by mouth daily.     ondansetron (ZOFRAN ODT) 4 MG disintegrating tablet Take 1 tablet (4 mg total) by mouth every 8 (eight) hours as needed for nausea or vomiting. (Patient not taking: Reported on 03/12/2021) 20 tablet 0   tamsulosin (FLOMAX) 0.4 MG CAPS capsule Take 0.4 mg by mouth daily.     triamcinolone cream (KENALOG) 0.1 % Apply 1 application topically 2 (two) times daily. (Patient not taking: Reported on 03/12/2021) 30 g 0  No facility-administered medications prior to visit.    Allergies  Allergen Reactions   Prednisone Other (See Comments)    Reaction: extremity numbness    ROS Review of Systems  Constitutional: Negative.   HENT: Negative.    Respiratory: Negative.    Cardiovascular: Negative.   Gastrointestinal:  Positive for abdominal pain, constipation, diarrhea, nausea and vomiting. Negative for blood in stool.  Genitourinary: Negative.   Musculoskeletal: Negative.   Skin: Negative.   Neurological: Negative.   Psychiatric/Behavioral: Negative.       Objective:    Physical Exam Vitals and nursing note reviewed.  Constitutional:       Appearance: Normal appearance.  HENT:     Head: Normocephalic.  Eyes:     Conjunctiva/sclera: Conjunctivae normal.  Cardiovascular:     Rate and Rhythm: Normal rate and regular rhythm.     Pulses: Normal pulses.     Heart sounds: Normal heart sounds.  Pulmonary:     Effort: Pulmonary effort is normal.     Breath sounds: Normal breath sounds.  Abdominal:     General: Bowel sounds are normal.     Palpations: Abdomen is soft.     Tenderness: There is abdominal tenderness (LLQ). There is no guarding or rebound.  Musculoskeletal:     Cervical back: Normal range of motion.  Skin:    General: Skin is warm and dry.  Neurological:     General: No focal deficit present.     Mental Status: He is alert and oriented to person, place, and time.  Psychiatric:        Mood and Affect: Mood normal.        Behavior: Behavior normal.        Thought Content: Thought content normal.        Judgment: Judgment normal.    BP 102/65   Pulse 71   Temp 98.6 F (37 C) (Oral)   Wt 240 lb 3.2 oz (109 kg)   SpO2 99%   BMI 31.18 kg/m  Wt Readings from Last 3 Encounters:  03/12/21 240 lb 3.2 oz (109 kg)  07/11/20 229 lb 9.6 oz (104.1 kg)  03/22/19 210 lb (95.3 kg)     Health Maintenance Due  Topic Date Due   COVID-19 Vaccine (1) Never done   Pneumococcal Vaccine 63-71 Years old (1 - PCV) Never done   HPV VACCINES (1 - Male 2-dose series) Never done   INFLUENZA VACCINE  02/11/2021       Topic Date Due   HPV VACCINES (1 - Male 2-dose series) Never done    No results found for: TSH Lab Results  Component Value Date   WBC 11.1 (H) 01/27/2019   HGB 14.9 01/27/2019   HCT 45.5 01/27/2019   MCV 90.6 01/27/2019   PLT 290 01/27/2019   Lab Results  Component Value Date   NA 141 01/27/2019   K 3.5 01/27/2019   CO2 27 01/27/2019   GLUCOSE 116 (H) 01/27/2019   BUN 16 01/27/2019   CREATININE 1.15 01/27/2019   BILITOT 0.9 01/27/2019   ALKPHOS 56 01/27/2019   AST 22 01/27/2019   ALT 31  01/27/2019   PROT 7.7 01/27/2019   ALBUMIN 4.7 01/27/2019   CALCIUM 9.6 01/27/2019   ANIONGAP 10 01/27/2019   No results found for: CHOL No results found for: HDL No results found for: LDLCALC No results found for: TRIG No results found for: CHOLHDL No results found for: ZOXW9U    Assessment &  Plan:   Problem List Items Addressed This Visit       Other   Left lower quadrant abdominal pain - Primary    Chronic ongoing. Has noted intermittent abdominal pain for the last several years. CT abdomen reviewed from 2020 with no cause identified. No red flags on exam. Has endorsed intermittent constipation and feeling like he needs to have a bowel movement but unable x2 today. Will have him start miralax daily. Will also place referral to GI for further evaluation. He states he is not good with needles and declines to have any labs drawn today.      Relevant Orders   Ambulatory referral to Gastroenterology    Meds ordered this encounter  Medications   polyethylene glycol powder (GLYCOLAX/MIRALAX) 17 GM/SCOOP powder    Sig: Take 17 g by mouth daily.    Dispense:  850 g    Refill:  1    Follow-up: Return if symptoms worsen or fail to improve.    Gerre Scull, NP

## 2021-05-09 ENCOUNTER — Ambulatory Visit: Payer: 59 | Admitting: Gastroenterology

## 2021-12-25 ENCOUNTER — Ambulatory Visit: Payer: 59 | Admitting: Internal Medicine

## 2022-01-16 ENCOUNTER — Ambulatory Visit
Admission: RE | Admit: 2022-01-16 | Discharge: 2022-01-16 | Disposition: A | Discharge status: Home / Self Care | Payer: 59 | Source: Ambulatory Visit | Attending: Emergency Medicine | Admitting: Emergency Medicine

## 2022-01-16 VITALS — BP 123/68 | HR 69 | Temp 98.5°F | Resp 16

## 2022-01-16 DIAGNOSIS — L551 Sunburn of second degree: Secondary | ICD-10-CM

## 2022-01-16 MED ORDER — SILVER SULFADIAZINE 1 % EX CREA: 1.0000 | TOPICAL_CREAM | Freq: Every day | CUTANEOUS | 0 refills | Status: DC

## 2022-01-16 MED ORDER — IBUPROFEN 800 MG PO TABS: 800.0000 mg | ORAL_TABLET | Freq: Three times a day (TID) | ORAL | 0 refills | Status: DC | PRN

## 2022-01-16 NOTE — Discharge Instructions (Addendum)
Use the Silvadene cream and take the ibuprofen as directed.  Follow up with your primary care provider.

## 2022-01-16 NOTE — ED Triage Notes (Signed)
Pt was at the [facility] for 5 hours yesterday and noticed blisters and sunburn on bilateral shoulders. He applied Aloe to the area.

## 2022-01-16 NOTE — ED Provider Notes (Signed)
Renaldo Fiddler    CSN: [id number] Arrival date & time: 01/16/22  1050      History   Chief Complaint Chief Complaint  Patient presents with   Blister    Sun poisoning - Entered by patient   Sunburn    HPI Curtis Burns is a 26 y.o. male.  Patient presents blistering sunburn on his shoulders and sunburn on his back, chest, abdomen.  The sunburn occurred on 01/14/2022.  He was in the sun for 5 hours and did not use sunscreen.  Treatment at home with aloe.  He has been keeping himself hydrated with water also.  No fever, chills, nausea, vomiting, headache, chest pain, shortness of breath, or other symptoms.  His medical history includes asthma, allergic rhinitis, seizures.  The history is provided by the patient and medical records.    Past Medical History:  Diagnosis Date   Allergic rhinitis    Asthma    Overweight    Seizures (HCC)    Testicular cyst     Patient Active Problem List   Diagnosis Date Noted   Left lower quadrant abdominal pain 03/12/2021   Syncopal seizure (HCC) 08/30/2019    History reviewed. No pertinent surgical history.     Home Medications    Prior to Admission medications   Medication Sig Start Date End Date Taking? Authorizing Provider  ibuprofen (ADVIL) 800 MG tablet Take 1 tablet (800 mg total) by mouth every 8 (eight) hours as needed. 01/16/22  Yes Mickie Bail, NP  LamoTRIgine 100 MG TB24 24 hour tablet Take 1 tablet by mouth daily. 09/21/19 01/16/22 Yes [provider]  silver sulfADIAZINE (SILVADENE) 1 % cream Apply 1 Application topically daily. 01/16/22  Yes Mickie Bail, NP  hydrocortisone 2.5 % lotion Apply topically 2 (two) times daily. Apply lightly on rash 2 times a day x 7-10days. 01/30/21   Rodriguez-Southworth, Nettie Elm, PA-C  ondansetron (ZOFRAN ODT) 4 MG disintegrating tablet Take 1 tablet (4 mg total) by mouth every 8 (eight) hours as needed for nausea or vomiting. Patient not taking: Reported on 03/12/2021  03/05/21   Mickie Bail, NP  polyethylene glycol powder (GLYCOLAX/MIRALAX) 17 GM/SCOOP powder Take 17 g by mouth daily. 03/12/21   McElwee, Lauren A, NP  tamsulosin (FLOMAX) 0.4 MG CAPS capsule Take 0.4 mg by mouth daily. 09/19/20   [provider]  triamcinolone cream (KENALOG) 0.1 % Apply 1 application topically 2 (two) times daily. Patient not taking: Reported on 03/12/2021 01/29/21   Amalia Greenhouse, FNP  ipratropium (ATROVENT) 0.02 % nebulizer solution Take 2.5 mLs (0.5 mg total) by nebulization 4 (four) times daily. 06/13/18 03/22/19  Tommi Rumps, PA-C    Family History Family History  Problem Relation Age of Onset   Kidney Stones Father    Kidney Stones Sister    Heart disease Paternal Grandfather     Social History Social History   Tobacco Use   Smoking status: Every Day    Packs/day: 0.50    Types: Cigarettes   Smokeless tobacco: Never  Vaping Use   Vaping Use: Never used  Substance Use Topics   Alcohol use: Yes    Comment: on occasion   Drug use: No     Allergies   Prednisone   Review of Systems Review of Systems  Constitutional:  Negative for chills and fever.  Respiratory:  Negative for cough and shortness of breath.   Cardiovascular:  Negative for chest pain and palpitations.  Gastrointestinal:  Negative for abdominal pain, nausea and vomiting.  Skin:  Positive for color change and rash.  Neurological:  Negative for dizziness, seizures, syncope, weakness, numbness and headaches.  All other systems reviewed and are negative.    Physical Exam Triage Vital Signs ED Triage Vitals  Enc Vitals Group     BP      Pulse      Resp      Temp      Temp src      SpO2      Weight      Height      Head Circumference      Peak Flow      Pain Score      Pain Loc      Pain Edu?      Excl. in GC?    No data found.  Updated Vital Signs BP 123/68 (BP Location: Left Arm)   Pulse 69   Temp 98.5 F (36.9 C) (Oral)   Resp 16   SpO2 98%   Visual  Acuity Right Eye Distance:   Left Eye Distance:   Bilateral Distance:    Right Eye Near:   Left Eye Near:    Bilateral Near:     Physical Exam Vitals and nursing note reviewed.  Constitutional:      General: He is not in acute distress.    Appearance: Normal appearance. He is well-developed. He is not ill-appearing.  HENT:     Mouth/Throat:     Mouth: Mucous membranes are moist.  Eyes:     Conjunctiva/sclera: Conjunctivae normal.  Cardiovascular:     Rate and Rhythm: Normal rate and regular rhythm.     Heart sounds: Normal heart sounds.  Pulmonary:     Effort: Pulmonary effort is normal. No respiratory distress.     Breath sounds: Normal breath sounds.  Musculoskeletal:     Cervical back: Neck supple.  Skin:    General: Skin is warm and dry.     Findings: Rash present.     Comments: Sunburn on shoulders, back, abdomen, chest.  Blisters on both shoulders.  Neurological:     Mental Status: He is alert.  Psychiatric:        Mood and Affect: Mood normal.        Behavior: Behavior normal.      UC Treatments / Results  Labs (all labs ordered are listed, but only abnormal results are displayed) Labs Reviewed - No data to display  EKG   Radiology No results found.  Procedures Procedures (including critical care time)  Medications Ordered in UC Medications - No data to display  Initial Impression / Assessment and Plan / UC Course  I have reviewed the triage vital signs and the nursing notes.  Pertinent labs & imaging results that were available during my care of the patient were reviewed by me and considered in my medical decision making (see chart for details).  Second-degree sunburn.  Blisters on both shoulders.  Treating with Silvadene cream and ibuprofen.  Discussed sunscreen in the future.  Education provided on sunburn.  Instructed patient to follow-up with his PCP.  Work note provided per patient request.  He agrees to plan of care.   Final Clinical  Impressions(s) / UC Diagnoses   Final diagnoses:  Sunburn of second degree     Discharge Instructions      Use the Silvadene cream and take the ibuprofen as directed.  Follow up with your primary care  provider.        ED Prescriptions     Medication Sig Dispense Auth. Provider   silver sulfADIAZINE (SILVADENE) 1 % cream Apply 1 Application topically daily. 50 g Mickie Bail, NP   ibuprofen (ADVIL) 800 MG tablet Take 1 tablet (800 mg total) by mouth every 8 (eight) hours as needed. 10 tablet Mickie Bail, NP      PDMP not reviewed this encounter.   Mickie Bail, NP 01/16/22 1141

## 2022-02-19 ENCOUNTER — Other Ambulatory Visit: Payer: Self-pay

## 2022-02-19 ENCOUNTER — Emergency Department
Admission: EM | Admit: 2022-02-19 | Discharge: 2022-02-19 | Disposition: A | Discharge status: Home / Self Care | Payer: 59 | Attending: Emergency Medicine | Admitting: Emergency Medicine

## 2022-02-19 ENCOUNTER — Emergency Department: Payer: 59

## 2022-02-19 ENCOUNTER — Telehealth: Payer: Self-pay | Admitting: Emergency Medicine

## 2022-02-19 DIAGNOSIS — R1032 Left lower quadrant pain: Secondary | ICD-10-CM | POA: Diagnosis present

## 2022-02-19 LAB — URINALYSIS, ROUTINE W REFLEX MICROSCOPIC
Bilirubin Urine: NEGATIVE
Glucose, UA: NEGATIVE mg/dL
Hgb urine dipstick: NEGATIVE
Ketones, ur: NEGATIVE mg/dL
Leukocytes,Ua: NEGATIVE
Nitrite: NEGATIVE
Protein, ur: NEGATIVE mg/dL
Specific Gravity, Urine: 1.027 (ref 1.005–1.030)
pH: 5 (ref 5.0–8.0)

## 2022-02-19 MED ORDER — DICYCLOMINE HCL 10 MG PO CAPS: 10.0000 mg | ORAL_CAPSULE | Freq: Three times a day (TID) | ORAL | 0 refills | Status: DC | PRN

## 2022-02-19 NOTE — Telephone Encounter (Signed)
Patient did not get his Bentyl prescription sent I have resent it to the pharmacy

## 2022-02-19 NOTE — ED Notes (Signed)
26 yom supine in the bed with his head elevated. The pt is warm, pink, and dry. The pt was alert and oriented x 4. The pt is c/o ongoing abdominal pain for the past month or so. The pt advised he was seen at [facility] yesterday ref the same but left AMA.

## 2022-02-19 NOTE — ED Provider Notes (Signed)
[facility] Provider Note    Event Date/Time   First MD Initiated Contact with Patient 02/19/22 0827     (approximate)  History   Chief Complaint: Abdominal Pain  HPI  Curtis Burns is a 26 y.o. male with no significant past medical history presents to the emergency department for left lower quadrant abdominal pain.  According to the patient for the past 6 months he has been experiencing intermittent pain in the left lower quadrant.  Patient states he went to [facility] yesterday but left prior to being seen as he had to pick up his daughter from preschool.  Patient denies any nausea vomiting or diarrhea.  Normal bowel movement this morning.  No urinary symptoms.  Patient does state history of kidney stones.  Physical Exam   Triage Vital Signs: ED Triage Vitals  Enc Vitals Group     BP 02/19/22 0828 138/77     Pulse Rate 02/19/22 0828 85     Resp 02/19/22 0828 16     Temp 02/19/22 0828 98.2 F (36.8 C)     Temp Source 02/19/22 0828 Oral     SpO2 02/19/22 0828 96 %     Weight 02/19/22 0829 220 lb (99.8 kg)     Height 02/19/22 0829 6\' 1"  (1.854 m)     Head Circumference --      Peak Flow --      Pain Score 02/19/22 0829 5     Pain Loc --      Pain Edu? --      Excl. in GC? --     Most recent vital signs: Vitals:   02/19/22 0828  BP: 138/77  Pulse: 85  Resp: 16  Temp: 98.2 F (36.8 C)  SpO2: 96%    General: Awake, no distress.  CV:  Good peripheral perfusion.  Regular rate and rhythm  Resp:  Normal effort.  Equal breath sounds bilaterally.  Abd:  No distention.  Soft, mild left lower quadrant tenderness to palpation otherwise benign abdomen without rebound or guarding   ED Results / Procedures / Treatments   RADIOLOGY  IMPRESSION: 1. No renal, ureteral or bladder calculi or mass. 2. No acute abdominal/pelvic findings, mass lesions or adenopathy.  MEDICATIONS ORDERED IN ED: Medications - No data to display   IMPRESSION  / MDM / ASSESSMENT AND PLAN / ED COURSE  I reviewed the triage vital signs and the nursing notes.  Patient's presentation is most consistent with acute presentation with potential threat to life or bodily function.  Patient presents to the emergency department for left lower quadrant abdominal pain intermittent over the past 6 months.  Patient went to [facility] yesterday but left prior to being seen.  I was able to review the patient's labs in care everywhere had a normal CBC normal chemistry.  We will check a urinalysis and proceed with a CT renal scan to further evaluate.  Differential would include ureterolithiasis less likely diverticulitis colitis, possibly IBS or IBD although no family history per patient.  I have reviewed the CT images.  On my interpretation no concerning finding. Radiology is read the CT scan as negative.  Urinalysis is normal.  Given the patient's reassuring CBC chemistry urinalysis and CT scan I believe the patient is safe for discharge home with GI follow-up.  Will prescribe Bentyl to see if this helps with patient's intermittent abdominal pain.  FINAL CLINICAL IMPRESSION(S) / ED DIAGNOSES   Left lower quadrant abdominal pain  Note:  This document was prepared using Dragon voice recognition software and may include unintentional dictation errors.   Minna Antis, MD 02/19/22 1004

## 2022-02-19 NOTE — ED Triage Notes (Signed)
Pt to ED For LLQ pain for months. States went to [facility] yesterday but left after blood work. Denies n/v currently.

## 2022-03-20 ENCOUNTER — Ambulatory Visit: Payer: 59 | Admitting: Physician Assistant

## 2022-03-20 NOTE — Progress Notes (Deleted)
I,Jana Nala Kachel,acting as a Neurosurgeon for [employer], PA-C.,have documented all relevant documentation on the behalf of Curtis Lat, PA-C,as directed by  [employer], PA-C while in the presence of [employer], PA-C.  New Patient Office Visit  Subjective    Patient ID: Curtis Burns, male    DOB: September 28, 1995  Age: 26 y.o. MRN: [id number]  CC: No chief complaint on file.   HPI Curtis Burns is a 26 yr old male presenting to establish care.     Outpatient Encounter Medications as of 03/20/2022  Medication Sig   dicyclomine (BENTYL) 10 MG capsule Take 1 capsule (10 mg total) by mouth 3 (three) times daily as needed for spasms.   hydrocortisone 2.5 % lotion Apply topically 2 (two) times daily. Apply lightly on rash 2 times a day x 7-10days.   ibuprofen (ADVIL) 800 MG tablet Take 1 tablet (800 mg total) by mouth every 8 (eight) hours as needed.   LamoTRIgine 100 MG TB24 24 hour tablet Take 1 tablet by mouth daily.   ondansetron (ZOFRAN ODT) 4 MG disintegrating tablet Take 1 tablet (4 mg total) by mouth every 8 (eight) hours as needed for nausea or vomiting. (Patient not taking: Reported on 03/12/2021)   polyethylene glycol powder (GLYCOLAX/MIRALAX) 17 GM/SCOOP powder Take 17 g by mouth daily.   silver sulfADIAZINE (SILVADENE) 1 % cream Apply 1 Application topically daily.   tamsulosin (FLOMAX) 0.4 MG CAPS capsule Take 0.4 mg by mouth daily.   triamcinolone cream (KENALOG) 0.1 % Apply 1 application topically 2 (two) times daily. (Patient not taking: Reported on 03/12/2021)   [DISCONTINUED] ipratropium (ATROVENT) 0.02 % nebulizer solution Take 2.5 mLs (0.5 mg total) by nebulization 4 (four) times daily.   No facility-administered encounter medications on file as of 03/20/2022.    Past Medical History:  Diagnosis Date   Allergic rhinitis    Asthma    Overweight    Seizures (HCC)    Testicular cyst     No past surgical history on file.  Family History  Problem  Relation Age of Onset   Kidney Stones Father    Kidney Stones Sister    Heart disease Paternal Grandfather     Social History   Socioeconomic History   Marital status: Single    Spouse name: Not on file   Number of children: Not on file   Years of education: Not on file   Highest education level: Not on file  Occupational History   Not on file  Tobacco Use   Smoking status: Every Day    Packs/day: 0.50    Types: Cigarettes   Smokeless tobacco: Never  Vaping Use   Vaping Use: Never used  Substance and Sexual Activity   Alcohol use: Yes    Comment: on occasion   Drug use: No   Sexual activity: Yes  Other Topics Concern   Not on file  Social History Narrative   Not on file   Social Determinants of Health   Financial Resource Strain: Not on file  Food Insecurity: Not on file  Transportation Needs: Not on file  Physical Activity: Not on file  Stress: Not on file  Social Connections: Not on file  Intimate Partner Violence: Not on file    ROS      Objective    There were no vitals taken for this visit.  Physical Exam  {Labs (Optional):23779}    Assessment & Plan:   Problem List Items Addressed This Visit  None   No follow-ups on file.   Sarina Ill, CMA

## 2022-06-13 DEATH — deceased

## 2022-09-12 ENCOUNTER — Ambulatory Visit
Admission: EM | Admit: 2022-09-12 | Discharge: 2022-09-12 | Disposition: A | Discharge status: Home / Self Care | Payer: Commercial Managed Care - PPO | Attending: Urgent Care | Admitting: Urgent Care

## 2022-09-12 ENCOUNTER — Ambulatory Visit: Payer: Self-pay

## 2022-09-12 DIAGNOSIS — J069 Acute upper respiratory infection, unspecified: Secondary | ICD-10-CM

## 2022-09-12 MED ORDER — BENZONATATE 100 MG PO CAPS: ORAL_CAPSULE | ORAL | 0 refills | Status: DC

## 2022-09-12 MED ORDER — HYDROCOD POLI-CHLORPHE POLI ER 10-8 MG/5ML PO SUER: 5.0000 mL | Freq: Two times a day (BID) | ORAL | 0 refills | Status: AC | PRN

## 2022-09-12 NOTE — ED Provider Notes (Signed)
Curtis Burns    CSN: [id number] Arrival date & time: 09/12/22  1302      History   Chief Complaint Chief Complaint  Patient presents with   Cough    HPI Curtis Burns is a 27 y.o. male.    Cough   Presents to urgent care with symptoms that started 3 days ago.  He endorses chills, body aches, cough, runny nose, sore throat, diarrhea.  Past Medical History:  Diagnosis Date   Allergic rhinitis    Asthma    Overweight    Seizures ([facility])    Testicular cyst     Patient Active Problem List   Diagnosis Date Noted   Left lower quadrant abdominal pain 03/12/2021   Syncopal seizure ([place]) 08/30/2019    History reviewed. No pertinent surgical history.     Home Medications    Prior to Admission medications   Medication Sig Start Date End Date Taking? Authorizing Provider  dicyclomine (BENTYL) 10 MG capsule Take 1 capsule (10 mg total) by mouth 3 (three) times daily as needed for spasms. 02/19/22   Harvest Dark, MD  hydrocortisone 2.5 % lotion Apply topically 2 (two) times daily. Apply lightly on rash 2 times a day x 7-10days. 01/30/21   Rodriguez-Southworth, Sunday Spillers, PA-C  ibuprofen (ADVIL) 800 MG tablet Take 1 tablet (800 mg total) by mouth every 8 (eight) hours as needed. 01/16/22   Sharion Balloon, NP  LamoTRIgine 100 MG TB24 24 hour tablet Take 1 tablet by mouth daily. 09/21/19 01/16/22  [provider]  ondansetron (ZOFRAN ODT) 4 MG disintegrating tablet Take 1 tablet (4 mg total) by mouth every 8 (eight) hours as needed for nausea or vomiting. Patient not taking: Reported on 03/12/2021 03/05/21   Sharion Balloon, NP  polyethylene glycol powder (GLYCOLAX/MIRALAX) 17 GM/SCOOP powder Take 17 g by mouth daily. 03/12/21   McElwee, Lauren A, NP  silver sulfADIAZINE (SILVADENE) 1 % cream Apply 1 Application topically daily. 01/16/22   Sharion Balloon, NP  tamsulosin (FLOMAX) 0.4 MG CAPS capsule Take 0.4 mg by mouth daily. 09/19/20   [provider]   triamcinolone cream (KENALOG) 0.1 % Apply 1 application topically 2 (two) times daily. Patient not taking: Reported on 03/12/2021 01/29/21   Serafina Royals, FNP  ipratropium (ATROVENT) 0.02 % nebulizer solution Take 2.5 mLs (0.5 mg total) by nebulization 4 (four) times daily. 06/13/18 03/22/19  Johnn Hai, PA-C    Family History Family History  Problem Relation Age of Onset   Kidney Stones Father    Kidney Stones Sister    Heart disease Paternal Grandfather     Social History Social History   Tobacco Use   Smoking status: Every Day    Packs/day: 0.50    Types: Cigarettes   Smokeless tobacco: Never  Vaping Use   Vaping Use: Never used  Substance Use Topics   Alcohol use: Yes    Comment: on occasion   Drug use: No     Allergies   Prednisone   Review of Systems Review of Systems  Respiratory:  Positive for cough.      Physical Exam Triage Vital Signs ED Triage Vitals  Enc Vitals Group     BP 09/12/22 1318 126/73     Pulse Rate 09/12/22 1318 67     Resp 09/12/22 1318 18     Temp 09/12/22 1318 98.2 F (36.8 C)     Temp Source 09/12/22 1318 Oral     SpO2 09/12/22  1318 96 %     Weight --      Height --      Head Circumference --      Peak Flow --      Pain Score 09/12/22 1321 0     Pain Loc --      Pain Edu? --      Excl. in [place]? --    No data found.  Updated Vital Signs BP 126/73 (BP Location: Left Arm)   Pulse 67   Temp 98.2 F (36.8 C) (Oral)   Resp 18   SpO2 96%   Visual Acuity Right Eye Distance:   Left Eye Distance:   Bilateral Distance:    Right Eye Near:   Left Eye Near:    Bilateral Near:     Physical Exam Vitals reviewed.  Constitutional:      Appearance: Normal appearance.  HENT:     Mouth/Throat:     Pharynx: No oropharyngeal exudate or posterior oropharyngeal erythema.  Cardiovascular:     Rate and Rhythm: Normal rate and regular rhythm.     Pulses: Normal pulses.     Heart sounds: Normal heart sounds.  Pulmonary:      Effort: Pulmonary effort is normal.     Breath sounds: Normal breath sounds.  Skin:    General: Skin is warm and dry.  Neurological:     General: No focal deficit present.     Mental Status: He is alert and oriented to person, place, and time.  Psychiatric:        Mood and Affect: Mood normal.        Behavior: Behavior normal.      UC Treatments / Results  Labs (all labs ordered are listed, but only abnormal results are displayed) Labs Reviewed - No data to display  EKG   Radiology No results found.  Procedures Procedures (including critical care time)  Medications Ordered in UC Medications - No data to display  Initial Impression / Assessment and Plan / UC Course  I have reviewed the triage vital signs and the nursing notes.  Pertinent labs & imaging results that were available during my care of the patient were reviewed by me and considered in my medical decision making (see chart for details).   Patient is afebrile here without recent antipyretics. Satting well on room air. Overall is ill appearing, well hydrated, without respiratory distress. Pulmonary exam is unremarkable.  Lungs CTAB without wheezing, rhonchi, rales.  No pharyngeal erythema but without peritonsillar exudates.  Symptoms are consistent for an acute viral process including influenza or COVID.  He is outside the treatment window for influenza and no antiviral therapy is recommended.  Recommending continued use of OTC medication for symptom control.  Suggested ibuprofen for his sore throat.  States his cough symptom is not adequately controlled with OTC medications so will prescribe benzonatate and Tussionex.   Final Clinical Impressions(s) / UC Diagnoses   Final diagnoses:  None   Discharge Instructions   None    ED Prescriptions   None    PDMP not reviewed this encounter.   Rose Phi, [place] 09/12/22 1337

## 2022-09-12 NOTE — Discharge Instructions (Addendum)
Follow up here or with your primary care provider if your symptoms are worsening or not improving.    

## 2022-09-12 NOTE — ED Triage Notes (Signed)
Chills, body aches, cough, runny nose, sore throat, diarrhea, that started 3 days ago. Taking Nyquil.

## 2023-04-14 ENCOUNTER — Telehealth: Payer: Self-pay

## 2023-04-14 ENCOUNTER — Ambulatory Visit
Admission: EM | Admit: 2023-04-14 | Discharge: 2023-04-14 | Disposition: A | Discharge status: Home / Self Care | Payer: Commercial Managed Care - PPO | Attending: Emergency Medicine | Admitting: Emergency Medicine

## 2023-04-14 DIAGNOSIS — B349 Viral infection, unspecified: Secondary | ICD-10-CM

## 2023-04-14 DIAGNOSIS — K121 Other forms of stomatitis: Secondary | ICD-10-CM | POA: Diagnosis not present

## 2023-04-14 LAB — POCT RAPID STREP A (OFFICE): Rapid Strep A Screen: NEGATIVE

## 2023-04-14 MED ORDER — LIDOCAINE VISCOUS HCL 2 % MT SOLN: 5.0000 mL | Freq: Four times a day (QID) | OROMUCOSAL | 0 refills | Status: DC

## 2023-04-14 MED ORDER — LIDOCAINE VISCOUS HCL 2 % MT SOLN
5.0000 mL | Freq: Four times a day (QID) | ORAL | 0 refills | Status: DC
Start: 2023-04-14 — End: 2023-10-26

## 2023-04-14 NOTE — Discharge Instructions (Addendum)
Your strep test is negative.    Use the Magic mouthwash as directed.    Take Tylenol or ibuprofen as needed for fever or discomfort.    Follow-up with your primary care provider if your symptoms are not improving.

## 2023-04-14 NOTE — Telephone Encounter (Signed)
[facility] unable to fill patient's prescriptions from visit today.  Confirmed availability at [facility] in [place] w/ Pharmacist per patient's request.   Medications sent to pharmacy. See orders. Discussed with Wendee Beavers NP.

## 2023-04-14 NOTE — ED Provider Notes (Signed)
Curtis Burns    CSN: [id number] Arrival date & time: 04/14/23  0836      History   Chief Complaint Chief Complaint  Patient presents with   URI    HPI Curtis Burns is a 27 y.o. male.  Patient presents with 3-day history of sore throat, congestion, sneezing, cough.  This morning he noticed a painful "rough spot" on the roof of his mouth.  No fever, wheezing, shortness of breath, or other symptoms.  He has been treating his symptoms with DayQuil and NyQuil.  His medical history includes asthma, allergies, seizures.  The history is provided by the patient and medical records.    Past Medical History:  Diagnosis Date   Allergic rhinitis    Asthma    Overweight    Seizures ([facility])    Testicular cyst     Patient Active Problem List   Diagnosis Date Noted   Left lower quadrant abdominal pain 03/12/2021   Syncopal seizure ([facility]) 08/30/2019    History reviewed. No pertinent surgical history.     Home Medications    Prior to Admission medications   Medication Sig Start Date End Date Taking? Authorizing Provider  magic mouthwash (lidocaine, diphenhydrAMINE, alum & mag hydroxide) suspension Swish and spit 5 mLs 4 (four) times daily. 04/14/23  Yes Mickie Bail, NP  benzonatate (TESSALON) 100 MG capsule Take 1-2 tablets 3 times a day as needed for cough 09/12/22   Immordino, Jeannett Senior, FNP  dicyclomine (BENTYL) 10 MG capsule Take 1 capsule (10 mg total) by mouth 3 (three) times daily as needed for spasms. 02/19/22   Minna Antis, MD  hydrocortisone 2.5 % lotion Apply topically 2 (two) times daily. Apply lightly on rash 2 times a day x 7-10days. 01/30/21   Rodriguez-Southworth, Nettie Elm, PA-C  ibuprofen (ADVIL) 800 MG tablet Take 1 tablet (800 mg total) by mouth every 8 (eight) hours as needed. 01/16/22   Mickie Bail, NP  LamoTRIgine 100 MG TB24 24 hour tablet Take 1 tablet by mouth daily. 09/21/19 01/16/22  [provider]  ondansetron (ZOFRAN ODT) 4 MG  disintegrating tablet Take 1 tablet (4 mg total) by mouth every 8 (eight) hours as needed for nausea or vomiting. Patient not taking: Reported on 03/12/2021 03/05/21   Mickie Bail, NP  polyethylene glycol powder (GLYCOLAX/MIRALAX) 17 GM/SCOOP powder Take 17 g by mouth daily. 03/12/21   McElwee, Lauren A, NP  silver sulfADIAZINE (SILVADENE) 1 % cream Apply 1 Application topically daily. 01/16/22   Mickie Bail, NP  tamsulosin (FLOMAX) 0.4 MG CAPS capsule Take 0.4 mg by mouth daily. 09/19/20   [provider]  triamcinolone cream (KENALOG) 0.1 % Apply 1 application topically 2 (two) times daily. Patient not taking: Reported on 03/12/2021 01/29/21   Amalia Greenhouse, FNP  ipratropium (ATROVENT) 0.02 % nebulizer solution Take 2.5 mLs (0.5 mg total) by nebulization 4 (four) times daily. 06/13/18 03/22/19  Tommi Rumps, PA-C    Family History Family History  Problem Relation Age of Onset   Kidney Stones Father    Kidney Stones Sister    Heart disease Paternal Grandfather     Social History Social History   Tobacco Use   Smoking status: Every Day    Current packs/day: 0.50    Types: Cigarettes   Smokeless tobacco: Never  Vaping Use   Vaping status: Never Used  Substance Use Topics   Alcohol use: Yes    Comment: on occasion   Drug use: No  Allergies   Prednisone   Review of Systems Review of Systems  Constitutional:  Negative for chills and fever.  HENT:  Positive for congestion, mouth sores and sore throat. Negative for ear pain.   Respiratory:  Positive for cough. Negative for shortness of breath.   Cardiovascular:  Negative for chest pain and palpitations.     Physical Exam Triage Vital Signs ED Triage Vitals  Encounter Vitals Group     BP      Systolic BP Percentile      Diastolic BP Percentile      Pulse      Resp      Temp      Temp src      SpO2      Weight      Height      Head Circumference      Peak Flow      Pain Score      Pain Loc      Pain  Education      Exclude from Growth Chart    No data found.  Updated Vital Signs BP 117/78   Pulse 82   Temp 98.6 F (37 C)   Resp 16   SpO2 98%   Visual Acuity Right Eye Distance:   Left Eye Distance:   Bilateral Distance:    Right Eye Near:   Left Eye Near:    Bilateral Near:     Physical Exam Vitals and nursing note reviewed.  Constitutional:      General: He is not in acute distress.    Appearance: He is well-developed.  HENT:     Right Ear: Tympanic membrane normal.     Left Ear: Tympanic membrane normal.     Nose: Nose normal.     Mouth/Throat:     Mouth: Mucous membranes are moist.     Comments: Patch of ulcers on palate and on left buccal mucosa. Cardiovascular:     Rate and Rhythm: Normal rate and regular rhythm.     Heart sounds: Normal heart sounds.  Pulmonary:     Effort: Pulmonary effort is normal. No respiratory distress.     Breath sounds: Normal breath sounds. No wheezing.  Musculoskeletal:     Cervical back: Neck supple.  Skin:    General: Skin is warm and dry.  Neurological:     Mental Status: He is alert.  Psychiatric:        Mood and Affect: Mood normal.        Behavior: Behavior normal.      UC Treatments / Results  Labs (all labs ordered are listed, but only abnormal results are displayed) Labs Reviewed  POCT RAPID STREP A (OFFICE)    EKG   Radiology No results found.  Procedures Procedures (including critical care time)  Medications Ordered in UC Medications - No data to display  Initial Impression / Assessment and Plan / UC Course  I have reviewed the triage vital signs and the nursing notes.  Pertinent labs & imaging results that were available during my care of the patient were reviewed by me and considered in my medical decision making (see chart for details).    Mouth ulcers, viral illness.  Rapid strep negative.  Treating with Magic mouthwash.  Education provided on mouth ulcers and viral illnesses.  Discussed  Tylenol or ibuprofen as needed.  Work note provided per patient request.  Instructed him to follow-up with his PCP if he is not improving.  He agrees to plan of care.  Final Clinical Impressions(s) / UC Diagnoses   Final diagnoses:  Mouth ulcer  Viral illness     Discharge Instructions      Your strep test is negative.    Use the Magic mouthwash as directed.    Take Tylenol or ibuprofen as needed for fever or discomfort.    Follow-up with your primary care provider if your symptoms are not improving.        ED Prescriptions     Medication Sig Dispense Auth. Provider   magic mouthwash (lidocaine, diphenhydrAMINE, alum & mag hydroxide) suspension Swish and spit 5 mLs 4 (four) times daily. 100 mL Mickie Bail, NP      PDMP not reviewed this encounter.   Mickie Bail, NP 04/14/23 [phone]

## 2023-04-14 NOTE — ED Triage Notes (Signed)
Patient to Urgent Care with complaints of sore throat/ sneezing/ cough/ head congestion. Reports he has a "rough spot" on the roof of his mouth.  Reports symptoms started three days ago.  Denies any known fevers.

## 2023-06-25 ENCOUNTER — Emergency Department: Payer: Commercial Managed Care - PPO

## 2023-06-25 ENCOUNTER — Other Ambulatory Visit: Payer: Self-pay

## 2023-06-25 ENCOUNTER — Emergency Department
Admission: EM | Admit: 2023-06-25 | Discharge: 2023-06-25 | Disposition: A | Discharge status: Home / Self Care | Payer: Commercial Managed Care - PPO | Attending: Emergency Medicine | Admitting: Emergency Medicine

## 2023-06-25 ENCOUNTER — Encounter: Payer: Self-pay | Admitting: Emergency Medicine

## 2023-06-25 DIAGNOSIS — R519 Headache, unspecified: Secondary | ICD-10-CM | POA: Insufficient documentation

## 2023-06-25 MED ORDER — PROCHLORPERAZINE MALEATE 5 MG PO TABS
5.0000 mg | ORAL_TABLET | Freq: Once | ORAL | Status: DC
  Filled 2023-06-25 (×2): qty 1

## 2023-06-25 MED ORDER — IBUPROFEN 600 MG PO TABS
600.0000 mg | ORAL_TABLET | Freq: Once | ORAL | Status: AC
  Administered 2023-06-25: 600 mg via ORAL
  Filled 2023-06-25: qty 1

## 2023-06-25 MED ORDER — DIPHENHYDRAMINE HCL 25 MG PO CAPS
25.0000 mg | ORAL_CAPSULE | Freq: Once | ORAL | Status: AC
  Administered 2023-06-25: 25 mg via ORAL
  Filled 2023-06-25: qty 1

## 2023-06-25 NOTE — ED Notes (Signed)
ED Provider at bedside. 

## 2023-06-25 NOTE — ED Triage Notes (Signed)
Pt to ED via POV, pt c/o pain in the back of his head. Pt states that it "feels like when you shake your head really fast". Pt states that he has hx/o seizures, pt has not had a seizure since 03/02/23. Pt is in NAD.

## 2023-06-25 NOTE — Discharge Instructions (Signed)
Hydration at home is most important thing.  You can also take ibuprofen as needed for headache symptoms.  Please follow-up with your primary care provider as needed.

## 2023-06-25 NOTE — ED Provider Triage Note (Signed)
Emergency Medicine Provider Triage Evaluation Note  Curtis Burns , a 27 y.o. male  was evaluated in triage.  Pt complains of headache to the back of his head that began this morning. No dizziness. History of epilepsy, last seizure August 19. Had MRI in 2017 which was normal. Had vomiting x3 days in the morning. Not sure what he takes for his seizures, thinks maybe Trleptal, has not missed any doses.  Review of Systems  Positive: Headache, vomiting Negative: Neck pain, fever  Physical Exam  There were no vitals taken for this visit. Gen:   Awake, no distress   Resp:  Normal effort  MSK:   Moves extremities without difficulty  Other:    Medical Decision Making  Medically screening exam initiated at 10:16 AM.  Appropriate orders placed.  Curtis Burns was informed that the remainder of the evaluation will be completed by another provider, this initial triage assessment does not replace that evaluation, and the importance of remaining in the ED until their evaluation is complete.     Curtis Hoehn, PA-C 06/25/23 1020

## 2023-06-25 NOTE — ED Notes (Signed)
Pt. Was given discharge instructions, and instructed to wait for compazine. This RN to chair side to admin compazine when it arrived from pharmacy, pt. And sig other had left.

## 2023-06-25 NOTE — ED Provider Notes (Signed)
[facility] Provider Note    Event Date/Time   First MD Initiated Contact with Patient 06/25/23 1219     (approximate)   History   Headache   HPI Curtis Burns is a 27 y.o. male presenting today for headache.  Patient states he woke up this morning and shortly after slowly developed onset of a posterior headache.  Did not take any medication and came to the ED for further evaluation.  Has had intermittent nausea over the past couple of days but no vomiting.  Currently not nauseous.  Denies any numbness, weakness, vision changes, cough, congestion, abdominal pain.  Does have prior history of migraines.     Physical Exam   Triage Vital Signs: ED Triage Vitals  Encounter Vitals Group     BP 06/25/23 1020 119/72     Systolic BP Percentile --      Diastolic BP Percentile --      Pulse Rate 06/25/23 1020 (!) 59     Resp 06/25/23 1019 16     Temp 06/25/23 1019 98.6 F (37 C)     Temp src --      SpO2 06/25/23 1019 96 %     Weight 06/25/23 1020 230 lb (104.3 kg)     Height 06/25/23 1020 6\' 2"  (1.88 m)     Head Circumference --      Peak Flow --      Pain Score 06/25/23 1018 5     Pain Loc --      Pain Education --      Exclude from Growth Chart --     Most recent vital signs: Vitals:   06/25/23 1020 06/25/23 1255  BP: 119/72 121/76  Pulse: (!) 59 61  Resp:  18  Temp:  98.2 F (36.8 C)  SpO2: 100% 96%   I have reviewed the vital signs. General:  Awake, alert, no acute distress. Head:  Normocephalic, Atraumatic. EENT:  PERRL, EOMI, Oral mucosa pink and moist, Neck is supple. Cardiovascular: Regular rate, 2+ distal pulses. Respiratory:  Normal respiratory effort, symmetrical expansion, no distress.   Extremities:  Moving all four extremities through full ROM without pain.   Neuro:  Alert and oriented.  Interacting appropriately.  Sensation intact throughout all extremities.  No vision changes.  Speech normal.  5 out of 5 strength  intact throughout bilateral upper and lower extremities Skin:  Warm, dry, no rash.   Psych: Appropriate affect.    ED Results / Procedures / Treatments   Labs (all labs ordered are listed, but only abnormal results are displayed) Labs Reviewed - No data to display   EKG    RADIOLOGY Independently interpreted CT head with no acute pathology   PROCEDURES:  Critical Care performed: No  Procedures   MEDICATIONS ORDERED IN ED: Medications  ibuprofen (ADVIL) tablet 600 mg (has no administration in time range)  prochlorperazine (COMPAZINE) tablet 5 mg (has no administration in time range)  diphenhydrAMINE (BENADRYL) capsule 25 mg (has no administration in time range)     IMPRESSION / MDM / ASSESSMENT AND PLAN / ED COURSE  I reviewed the triage vital signs and the nursing notes.                              Differential diagnosis includes, but is not limited to, migraine headache, tension headache  Patient's presentation is most consistent with acute complicated illness / injury  requiring diagnostic workup.  Patient is a 27 year old male presenting today for posterior headache.  On exam, he has no accompanied neurological symptoms at all.  He is tolerating p.o. without issue.  Does have a history of migraines and seems consistent with that versus a tension headache.  CT was ordered while patient was in triage shows no acute pathology.  Symptoms most consistent with simple headache.  Patient given oral medication here and discharged.     FINAL CLINICAL IMPRESSION(S) / ED DIAGNOSES   Final diagnoses:  Bad headache     Rx / DC Orders   ED Discharge Orders     None        Note:  This document was prepared using Dragon voice recognition software and may include unintentional dictation errors.   Janith Lima, MD 06/25/23 1255

## 2023-10-12 ENCOUNTER — Ambulatory Visit
Admission: RE | Admit: 2023-10-12 | Discharge: 2023-10-12 | Discharge status: Against Medical Advice | Source: Ambulatory Visit

## 2023-10-14 ENCOUNTER — Ambulatory Visit

## 2023-10-14 DIAGNOSIS — F419 Anxiety disorder, unspecified: Secondary | ICD-10-CM | POA: Diagnosis not present

## 2023-10-14 DIAGNOSIS — K589 Irritable bowel syndrome without diarrhea: Secondary | ICD-10-CM | POA: Diagnosis not present

## 2023-10-20 DIAGNOSIS — F419 Anxiety disorder, unspecified: Secondary | ICD-10-CM | POA: Diagnosis not present

## 2023-10-26 ENCOUNTER — Ambulatory Visit (INDEPENDENT_AMBULATORY_CARE_PROVIDER_SITE_OTHER): Admitting: Nurse Practitioner

## 2023-10-26 ENCOUNTER — Encounter: Payer: Self-pay | Admitting: Nurse Practitioner

## 2023-10-26 VITALS — BP 110/66 | HR 76 | Temp 98.6°F | Resp 18 | Ht 74.02 in | Wt 247.0 lb

## 2023-10-26 DIAGNOSIS — F419 Anxiety disorder, unspecified: Secondary | ICD-10-CM | POA: Diagnosis not present

## 2023-10-26 MED ORDER — CITALOPRAM HYDROBROMIDE 10 MG PO TABS: 10.0000 mg | ORAL_TABLET | Freq: Every day | ORAL | 0 refills | Status: DC

## 2023-10-26 MED ORDER — BUSPIRONE HCL 10 MG PO TABS: 10.0000 mg | ORAL_TABLET | Freq: Three times a day (TID) | ORAL | 0 refills | Status: DC | PRN

## 2023-10-26 NOTE — Assessment & Plan Note (Signed)
 Chronic problem but recently exacerbated.  Will start Citalopram 10mg  daily.  Buspar increased to 10mg  TID.  Side effects and benefits of medication discussed.  States his mom and sister are on Abilify (can try this if other medications do not improve symptoms).  Follow up in 2 weeks.  Call sooner if concerns arise.

## 2023-10-26 NOTE — Progress Notes (Signed)
 BP 110/66 (BP Location: Left Arm, Patient Position: Sitting, Cuff Size: Large)   Pulse 76   Temp 98.6 F (37 C) (Oral)   Resp 18   Ht 6' 2.02" (1.88 m)   Wt 247 lb (112 kg)   SpO2 98%   BMI 31.70 kg/m    Subjective:    Patient ID: Curtis Burns, male    DOB: 01/19/1996, 28 y.o.   MRN: [id number]  HPI: Curtis Burns is a 28 y.o. male  Chief Complaint  Patient presents with   Mental health    Feels judgement from others, not leaving his house. Feels lots of anxiety when around. Wife feels like he stutters even when around others as if scared to talk. Tends to get worked up quicker. Short- tempter but not to the point of physical contact. Feels during warm sensation over his body towards his chest, jittery, needs to find something to distract himself. On leave from work due to how bad this has gotten.    MOOD Patient states he has been having mental health concerns for about 3 years.  Feels like recently it has worsened.  He started a new job and feels like everyone is looking down on him.  He was seen in UC last week and given Hydroxyzine- gave him a bad headache, and buspar.       10/26/2023    3:22 PM  GAD 7 : Generalized Anxiety Score  Nervous, Anxious, on Edge 2  Control/stop worrying 2  Worry too much - different things 2  Trouble relaxing 1  Restless 0  Easily annoyed or irritable 2  Afraid - awful might happen 1  Total GAD 7 Score 10  Anxiety Difficulty Very difficult    Flowsheet Row Office Visit from 10/26/2023 in [facility]  PHQ-9 Total Score 9           Relevant past medical, surgical, family and social history reviewed and updated as indicated. Interim medical history since our last visit reviewed. Allergies and medications reviewed and updated.  Review of Systems  Psychiatric/Behavioral:  The patient is nervous/anxious.     Per HPI unless specifically indicated above     Objective:    BP 110/66 (BP  Location: Left Arm, Patient Position: Sitting, Cuff Size: Large)   Pulse 76   Temp 98.6 F (37 C) (Oral)   Resp 18   Ht 6' 2.02" (1.88 m)   Wt 247 lb (112 kg)   SpO2 98%   BMI 31.70 kg/m   Wt Readings from Last 3 Encounters:  10/26/23 247 lb (112 kg)  06/25/23 230 lb (104.3 kg)  02/19/22 220 lb (99.8 kg)    Physical Exam Vitals and nursing note reviewed.  Constitutional:      General: He is not in acute distress.    Appearance: Normal appearance. He is not ill-appearing, toxic-appearing or diaphoretic.  HENT:     Head: Normocephalic.     Right Ear: External ear normal.     Left Ear: External ear normal.     Nose: Nose normal. No congestion or rhinorrhea.     Mouth/Throat:     Mouth: Mucous membranes are moist.  Eyes:     General:        Right eye: No discharge.        Left eye: No discharge.     Extraocular Movements: Extraocular movements intact.     Conjunctiva/sclera: Conjunctivae normal.     Pupils:  Pupils are equal, round, and reactive to light.  Cardiovascular:     Rate and Rhythm: Normal rate and regular rhythm.     Heart sounds: No murmur heard. Pulmonary:     Effort: Pulmonary effort is normal. No respiratory distress.     Breath sounds: Normal breath sounds. No wheezing, rhonchi or rales.  Abdominal:     General: Abdomen is flat. Bowel sounds are normal.  Musculoskeletal:     Cervical back: Normal range of motion and neck supple.  Skin:    General: Skin is warm and dry.     Capillary Refill: Capillary refill takes less than 2 seconds.  Neurological:     General: No focal deficit present.     Mental Status: He is alert and oriented to person, place, and time.  Psychiatric:        Mood and Affect: Mood normal.        Behavior: Behavior normal.        Thought Content: Thought content normal.        Judgment: Judgment normal.     Results for orders placed or performed during the hospital encounter of 04/14/23  POCT rapid strep A   Collection Time:  04/14/23  8:57 AM  Result Value Ref Range   Rapid Strep A Screen Negative Negative      Assessment & Plan:   Problem List Items Addressed This Visit       Other   Anxiety - Primary   Chronic problem but recently exacerbated.  Will start Citalopram 10mg  daily.  Buspar increased to 10mg  TID.  Side effects and benefits of medication discussed.  States his mom and sister are on Abilify (can try this if other medications do not improve symptoms).  Follow up in 2 weeks.  Call sooner if concerns arise.      Relevant Medications   citalopram (CELEXA) 10 MG tablet   busPIRone (BUSPAR) 10 MG tablet     Follow up plan: Return in about 2 weeks (around 11/09/2023) for Depression/Anxiety FU (virtual).

## 2023-11-11 ENCOUNTER — Ambulatory Visit: Admitting: Nurse Practitioner

## 2023-11-11 NOTE — Progress Notes (Deleted)
 There were no vitals taken for this visit.   Subjective:    Patient ID: Curtis Burns, male    DOB: [birth year], 28 y.o.   MRN: [id number]  HPI: Curtis Burns is a 28 y.o. male  No chief complaint on file.  MOOD Patient states he has been having mental health concerns for about 3 years.  Feels like recently it has worsened.  He started a new job and feels like everyone is looking down on him.  He was seen in UC last week and given Hydroxyzine- gave him a bad headache, and buspar .       10/26/2023    3:22 PM  GAD 7 : Generalized Anxiety Score  Nervous, Anxious, on Edge 2  Control/stop worrying 2  Worry too much - different things 2  Trouble relaxing 1  Restless 0  Easily annoyed or irritable 2  Afraid - awful might happen 1  Total GAD 7 Score 10  Anxiety Difficulty Very difficult    Flowsheet Row Office Visit from 10/26/2023 in [facility]  PHQ-9 Total Score 9           Relevant past medical, surgical, family and social history reviewed and updated as indicated. Interim medical history since our last visit reviewed. Allergies and medications reviewed and updated.  Review of Systems  Psychiatric/Behavioral:  The patient is nervous/anxious.     Per HPI unless specifically indicated above     Objective:    There were no vitals taken for this visit.  Wt Readings from Last 3 Encounters:  10/26/23 247 lb (112 kg)  06/25/23 230 lb (104.3 kg)  02/19/22 220 lb (99.8 kg)    Physical Exam Vitals and nursing note reviewed.  Constitutional:      General: He is not in acute distress.    Appearance: Normal appearance. He is not ill-appearing, toxic-appearing or diaphoretic.  HENT:     Head: Normocephalic.     Right Ear: External ear normal.     Left Ear: External ear normal.     Nose: Nose normal. No congestion or rhinorrhea.     Mouth/Throat:     Mouth: Mucous membranes are moist.  Eyes:     General:        Right eye: No  discharge.        Left eye: No discharge.     Extraocular Movements: Extraocular movements intact.     Conjunctiva/sclera: Conjunctivae normal.     Pupils: Pupils are equal, round, and reactive to light.  Cardiovascular:     Rate and Rhythm: Normal rate and regular rhythm.     Heart sounds: No murmur heard. Pulmonary:     Effort: Pulmonary effort is normal. No respiratory distress.     Breath sounds: Normal breath sounds. No wheezing, rhonchi or rales.  Abdominal:     General: Abdomen is flat. Bowel sounds are normal.  Musculoskeletal:     Cervical back: Normal range of motion and neck supple.  Skin:    General: Skin is warm and dry.     Capillary Refill: Capillary refill takes less than 2 seconds.  Neurological:     General: No focal deficit present.     Mental Status: He is alert and oriented to person, place, and time.  Psychiatric:        Mood and Affect: Mood normal.        Behavior: Behavior normal.        Thought Content:  Thought content normal.        Judgment: Judgment normal.    Results for orders placed or performed during the hospital encounter of 04/14/23  POCT rapid strep A   Collection Time: 04/14/23  8:57 AM  Result Value Ref Range   Rapid Strep A Screen Negative Negative      Assessment & Plan:   Problem List Items Addressed This Visit   None     Follow up plan: No follow-ups on file.

## 2023-11-17 ENCOUNTER — Other Ambulatory Visit: Payer: Self-pay | Admitting: Nurse Practitioner

## 2023-11-19 NOTE — Telephone Encounter (Signed)
 Requested medication (s) are due for refill today: yes  Requested medication (s) are on the active medication list: yes  Last refill:  10/26/23 #90 0 refills  Future visit scheduled: no  Notes to clinic:  last OV 10/26/23. Pharmacy comment: REQUEST FOR 90 DAYS PRESCRIPTION.  Do you want to refill for 90 days?     Requested Prescriptions  Pending Prescriptions Disp Refills   busPIRone  (BUSPAR ) 10 MG tablet [Pharmacy Med Name: BUSPIRONE  HCL 10 MG TABLET] 270 tablet 1    Sig: TAKE 1 TABLET BY MOUTH THREE TIMES A DAY AS NEEDED     Psychiatry: Anxiolytics/Hypnotics - Non-controlled Failed - 11/19/2023  9:28 AM      Failed - Valid encounter within last 12 months    Recent Outpatient Visits           3 weeks ago Anxiety   [facility] [place] Aileen Alexanders, NP

## 2023-12-17 ENCOUNTER — Other Ambulatory Visit: Payer: Self-pay | Admitting: Nurse Practitioner

## 2023-12-18 NOTE — Telephone Encounter (Signed)
 Requested medications are due for refill today.  yes  Requested medications are on the active medications list.  yes  Last refill. 10/2023 #60 0 rf  Future visit scheduled.   no  Notes to clinic.  New medication to this pt.    Requested Prescriptions  Pending Prescriptions Disp Refills   citalopram  (CELEXA ) 10 MG tablet [Pharmacy Med Name: CITALOPRAM  HBR 10 MG TABLET] 90 tablet 1    Sig: TAKE 1 TABLET BY MOUTH EVERY DAY     Psychiatry:  Antidepressants - SSRI Failed - 12/18/2023 12:28 PM      Failed - Valid encounter within last 6 months    Recent Outpatient Visits           1 month ago Anxiety   [facility] [facility] Aileen Alexanders, NP

## 2023-12-22 ENCOUNTER — Other Ambulatory Visit: Payer: Self-pay | Admitting: Nurse Practitioner

## 2023-12-23 NOTE — Telephone Encounter (Signed)
 Requested Prescriptions  Pending Prescriptions Disp Refills   citalopram  (CELEXA ) 10 MG tablet [Pharmacy Med Name: CITALOPRAM  HBR 10 MG TABLET] 90 tablet 0    Sig: TAKE 1 TABLET BY MOUTH EVERY DAY     Psychiatry:  Antidepressants - SSRI Failed - 12/23/2023 11:58 AM      Failed - Valid encounter within last 6 months    Recent Outpatient Visits           1 month ago Anxiety   [facility] [place], Mariah Shines, NP               busPIRone  (BUSPAR ) 10 MG tablet [Pharmacy Med Name: BUSPIRONE  HCL 10 MG TABLET] 270 tablet 0    Sig: TAKE 1 TABLET BY MOUTH THREE TIMES A DAY AS NEEDED     Psychiatry: Anxiolytics/Hypnotics - Non-controlled Failed - 12/23/2023 11:58 AM      Failed - Valid encounter within last 12 months    Recent Outpatient Visits           1 month ago Anxiety   [facility] Aileen Alexanders, NP

## 2024-02-10 ENCOUNTER — Telehealth (INDEPENDENT_AMBULATORY_CARE_PROVIDER_SITE_OTHER): Admitting: Nurse Practitioner

## 2024-02-10 ENCOUNTER — Other Ambulatory Visit

## 2024-02-10 ENCOUNTER — Encounter: Payer: Self-pay | Admitting: Nurse Practitioner

## 2024-02-10 DIAGNOSIS — M545 Low back pain, unspecified: Secondary | ICD-10-CM

## 2024-02-10 DIAGNOSIS — R112 Nausea with vomiting, unspecified: Secondary | ICD-10-CM

## 2024-02-10 NOTE — Progress Notes (Signed)
 There were no vitals taken for this visit.   Subjective:    Patient ID: Curtis Burns, male    DOB: Sep 03, 1995, 28 y.o.   MRN: [id number]  HPI: Curtis Burns is a 28 y.o. male  Chief Complaint  Patient presents with   Back Pain    Patient states he has been having lower back pain for the last 2 days. States he does not recall having any injuries to his back.    Emesis    Patient states he has been vomiting for the last 2 days. States he has also been having body aches and possible low grade fever per patient.    BACK PAIN Last night was lower left side and now it is up to his upper left side of his back.  Tossed and turned all night and used a heating pad.  He hasn't taken any medication.   Duration: back x 2 days Mechanism of injury: unknown Location: low back Onset: sudden Severity: 7/10 Quality: sharp and dull Frequency: intermittent Radiation: none Aggravating factors: standing up and stretching it out Alleviating factors: heat Status: better Treatments attempted: heat  Relief with NSAIDs?: No NSAIDs Taken Nighttime pain:  no Paresthesias / decreased sensation:  no Bowel / bladder incontinence:  no Fevers:  no Dysuria / urinary frequency:  no  Throwing up for the last two days.  First day was 4-5 times per day, yesterday 3x and today it was 1x.  Everything in his chest and stomach hurt.  Patient states he has a low grade fever but didn't check his temperature.  His legs and arms and feet started achy so he knew he had a temperature.  He is not having any pain with pushing on his stomach.  He does drink alcohol but hasn't drank hardly any in the last 4 months. Does not smoke marijuana regularly.  Occasionally on the weekends.   Relevant past medical, surgical, family and social history reviewed and updated as indicated. Interim medical history since our last visit reviewed. Allergies and medications reviewed and updated.  Review of Systems   Gastrointestinal:  Positive for nausea and vomiting.  Musculoskeletal:  Positive for back pain.    Per HPI unless specifically indicated above     Objective:    There were no vitals taken for this visit.  Wt Readings from Last 3 Encounters:  10/26/23 247 lb (112 kg)  06/25/23 230 lb (104.3 kg)  02/19/22 220 lb (99.8 kg)    Physical Exam Vitals and nursing note reviewed.  Constitutional:      General: He is not in acute distress.    Appearance: He is not ill-appearing.  HENT:     Head: Normocephalic.     Right Ear: Hearing normal.     Left Ear: Hearing normal.     Nose: Nose normal.  Pulmonary:     Effort: Pulmonary effort is normal. No respiratory distress.  Neurological:     Mental Status: He is alert.  Psychiatric:        Mood and Affect: Mood normal.        Behavior: Behavior normal.        Thought Content: Thought content normal.        Judgment: Judgment normal.     Results for orders placed or performed during the hospital encounter of 04/14/23  POCT rapid strep A   Collection Time: 04/14/23  8:57 AM  Result Value Ref Range   Rapid Strep A Screen  Negative Negative      Assessment & Plan:   Problem List Items Addressed This Visit   None Visit Diagnoses       Acute left-sided low back pain without sciatica    -  Primary   Exam limited due to type of visit. Could be kidney stone related. Will obtain UA. Recommend heating pad and NSAIDS to help with symptoms.   Relevant Orders   Urinalysis, Routine w reflex microscopic     Nausea and vomiting, unspecified vomiting type       Recommend bland diet.  Labs ordered.  Stay well hydrated.  Exam limited due to virtual visit. If symptoms worsen should be seen in ER.   Relevant Orders   Comp Met (CMET)   CBC w/Diff   Lipase        Follow up plan: No follow-ups on file.  This visit was completed via MyChart due to the restrictions of the COVID-19 pandemic. All issues as above were discussed and addressed.  Physical exam was done as above through visual confirmation on MyChart. If it was felt that the patient should be evaluated in the office, they were directed there. The patient verbally consented to this visit. Location of the patient: Home Location of the provider: Office Those involved with this call:  Provider: Darice Petty, NP CMA: Laymon Metro, CMA Front Desk/Registration: Claretta Maiden This encounter was conducted via video.  I spent 20 minutes dedicated to the care of this patient on the date of this encounter to include previsit review of plan of care and follow up, face to face time with the patient, and post visit ordering of testing.

## 2024-02-11 ENCOUNTER — Encounter: Payer: Self-pay | Admitting: Nurse Practitioner

## 2024-02-11 ENCOUNTER — Other Ambulatory Visit

## 2024-02-12 ENCOUNTER — Other Ambulatory Visit: Payer: Self-pay | Admitting: Nurse Practitioner

## 2024-02-12 ENCOUNTER — Telehealth: Admitting: Physician Assistant

## 2024-02-12 ENCOUNTER — Ambulatory Visit: Payer: Self-pay

## 2024-02-12 DIAGNOSIS — M545 Low back pain, unspecified: Secondary | ICD-10-CM

## 2024-02-12 MED ORDER — TAMSULOSIN HCL 0.4 MG PO CAPS: 0.4000 mg | ORAL_CAPSULE | Freq: Every day | ORAL | 3 refills | Status: AC

## 2024-02-12 MED ORDER — IBUPROFEN 800 MG PO TABS: 800.0000 mg | ORAL_TABLET | Freq: Three times a day (TID) | ORAL | 0 refills | Status: AC | PRN

## 2024-02-12 NOTE — Patient Instructions (Signed)
 Curtis Burns, thank you for joining Curtis CHRISTELLA Dickinson, PA-C for today's virtual visit.  While this provider is not your primary care provider (PCP), if your PCP is located in our provider database this encounter information will be shared with them immediately following your visit.   A [facility] MyChart account gives you access to today's visit and all your visits, tests, and labs performed at [facility]  click here if you don't have a [facility] MyChart account or go to [email or web address]  Consent: (Patient) Curtis Burns provided verbal consent for this virtual visit at the beginning of the encounter.  Current Medications:  Current Outpatient Medications:    ibuprofen  (ADVIL ) 800 MG tablet, Take 1 tablet (800 mg total) by mouth every 8 (eight) hours as needed., Disp: 30 tablet, Rfl: 0   tamsulosin  (FLOMAX ) 0.4 MG CAPS capsule, Take 1 capsule (0.4 mg total) by mouth daily., Disp: 30 capsule, Rfl: 3   busPIRone  (BUSPAR ) 10 MG tablet, TAKE 1 TABLET BY MOUTH THREE TIMES A DAY AS NEEDED, Disp: 270 tablet, Rfl: 0   citalopram  (CELEXA ) 10 MG tablet, TAKE 1 TABLET BY MOUTH EVERY DAY, Disp: 90 tablet, Rfl: 0   Medications ordered in this encounter:  Meds ordered this encounter  Medications   ibuprofen  (ADVIL ) 800 MG tablet    Sig: Take 1 tablet (800 mg total) by mouth every 8 (eight) hours as needed.    Dispense:  30 tablet    Refill:  0    Supervising Provider:   LAMPTEY, PHILIP O [[id number]]   tamsulosin  (FLOMAX ) 0.4 MG CAPS capsule    Sig: Take 1 capsule (0.4 mg total) by mouth daily.    Dispense:  30 capsule    Refill:  3    Supervising Provider:   LAMPTEY, PHILIP O [phone]     *If you need refills on other medications prior to your next appointment, please contact your pharmacy*  Follow-Up: Call back or seek an in-person evaluation if the symptoms worsen or if the condition fails to improve as anticipated.  [facility] [phone]  Other Instructions Kidney Stones  Kidney stones are solid, rock-like deposits that form inside of the kidneys. The kidneys are a pair of organs that make urine. A kidney stone may form in a kidney and move into other parts of the urinary tract, including the tubes that connect the kidneys to the bladder (ureters), the bladder, and the tube that carries urine out of the body (urethra). As the stone moves through these areas, it can cause intense pain and block the flow of urine. Kidney stones are created when high levels of certain minerals are found in the urine. The stones are usually passed out of the body through urination, but in some cases, medical treatment may be needed to remove them. What are the causes? Kidney stones may be caused by: A condition in which certain glands produce too much parathyroid hormone (primary hyperparathyroidism), which causes too much calcium buildup in the blood. A buildup of uric acid crystals in the bladder (hyperuricosuria). Uric acid is a chemical that the body produces when you eat certain foods. It usually leaves the body in the urine. Narrowing (stricture) of one or both of the ureters. A kidney blockage that is present at birth (congenital obstruction). Past surgery on the kidney or the ureters. What increases the risk? The following factors may make you more likely to develop this condition: Having had a kidney stone in  the past. Having a family history of kidney stones. Not drinking enough water. Eating a diet that is high in protein, salt (sodium), or sugar. Being overweight or obese. What are the signs or symptoms? Symptoms of a kidney stone may include: Pain in the side of the abdomen, right below the ribs (flank pain). Pain usually spreads (radiates) to the groin. Needing to urinate often or urgently. Painful urination. Blood in the urine (hematuria). Nausea. Vomiting. Fever and chills. How is this diagnosed? This condition may  be diagnosed based on: Your symptoms and medical history. A physical exam. Blood tests. Urine tests. These may be done before and after the stone passes out of your body through urination. Imaging tests, such as a CT scan, abdominal X-ray, or ultrasound. A procedure to examine the inside of the bladder (cystoscopy). How is this treated? Treatment for kidney stones depends on the size, location, and makeup of the stones. Kidney stones will often pass out of the body through urination. You may need to: Increase your fluid intake to help pass the stone. In some cases, you may be given fluids through an IV and may need to be monitored in the hospital. Take medicine for pain. Make changes in your diet to help prevent kidney stones from coming back. Sometimes, procedures are needed to remove a kidney stone. This may involve: A procedure to break up kidney stones using: A focused beam of light (laser therapy). Shock waves (extracorporeal shock wave lithotripsy). Surgery to remove kidney stones. This may be needed if you have severe pain or have stones that block your urinary tract. Follow these instructions at home: Medicines Take over-the-counter and prescription medicines only as told by your health care provider. Ask your health care provider if the medicine prescribed to you requires you to avoid driving or using heavy machinery. Eating and drinking Drink enough fluid to keep your urine pale yellow. You may be instructed to drink at least 8-10 glasses of water each day. This will help you pass the kidney stone. If directed, change your diet. This may include: Limiting how much sodium you eat. Eating more fruits and vegetables. Limiting how much animal protein you eat. Animal proteins include red meat, poultry, fish, and eggs. Eating a normal amount of calcium (1,000-1,300 mg per day). Follow instructions from your health care provider about eating or drinking restrictions. General  instructions Collect urine samples as told by your health care provider. You may need to collect a urine sample: 24 hours after you pass the stone. 8-12 weeks after you pass the kidney stone, and every 6-12 months after that. Strain your urine every time you urinate, for as long as directed. Use the strainer that your health care provider recommends. Do not throw out the kidney stone after passing it. Keep the stone so it can be tested by your health care provider. Testing the makeup of your kidney stone may help prevent you from getting kidney stones in the future. Keep all follow-up visits. You may need follow-up X-rays or ultrasounds to make sure that your stone has passed. How is this prevented? To prevent another kidney stone: Drink enough fluid to keep your urine pale yellow. This is the best way to prevent kidney stones. Eat a healthy diet. Follow recommendations from your health care provider about foods to avoid. Recommendations vary depending on the type of kidney stone that you have. You may be instructed to eat a low-protein diet. Maintain a healthy weight. Where to find more information  National Kidney Foundation (NKF): www.kidney.org [facility]): www.urologyhealth.org Contact a health care provider if: You have pain that gets worse or does not get better with medicine. Get help right away if: You have a fever or chills. You develop severe pain. You develop new abdominal pain. You faint. You are unable to urinate. Summary Kidney stones are solid, rock-like deposits that form inside of the kidneys. Kidney stones can cause nausea, vomiting, blood in the urine, abdominal pain, and the urge to urinate often. Treatment for kidney stones depends on the size, location, and makeup of the stones. Kidney stones will often pass out of the body through urination. Kidney stones can be prevented by drinking enough fluids, eating a healthy diet, and maintaining a healthy  weight. This information is not intended to replace advice given to you by your health care provider. Make sure you discuss any questions you have with your health care provider. Document Revised: 10/09/2021 Document Reviewed: 10/09/2021 Elsevier Patient Education  2024 Elsevier Inc.   If you have been instructed to have an in-person evaluation today at a local Urgent Care facility, please use the link below. It will take you to a list of all of our available [facility], including address, phone number and hours of operation. Please do not delay care.  [facility]  If you or a family member do not have a primary care provider, use the link below to schedule a visit and establish care. When you choose a [facility] primary care physician or advanced practice provider, you gain a long-term partner in health. Find a Primary Care Provider  Learn more about [facility] in-office and virtual care options:  - Get Care Now

## 2024-02-12 NOTE — Progress Notes (Signed)
 Virtual Visit Consent   Curtis Burns, you are scheduled for a virtual visit with a [facility] provider today. Just as with appointments in the office, your consent must be obtained to participate. Your consent will be active for this visit and any virtual visit you may have with one of our providers in the next 365 days. If you have a MyChart account, a copy of this consent can be sent to you electronically.  As this is a virtual visit, video technology does not allow for your provider to perform a traditional examination. This may limit your provider's ability to fully assess your condition. If your provider identifies any concerns that need to be evaluated in person or the need to arrange testing (such as labs, EKG, etc.), we will make arrangements to do so. Although advances in technology are sophisticated, we cannot ensure that it will always work on either your end or our end. If the connection with a video visit is poor, the visit may have to be switched to a telephone visit. With either a video or telephone visit, we are not always able to ensure that we have a secure connection.  By engaging in this virtual visit, you consent to the provision of healthcare and authorize for your insurance to be billed (if applicable) for the services provided during this visit. Depending on your insurance coverage, you may receive a charge related to this service.  I need to obtain your verbal consent now. Are you willing to proceed with your visit today? Curtis Burns has provided verbal consent on 02/12/2024 for a virtual visit (video or telephone). Curtis CHRISTELLA Dickinson, PA-C  Date: 02/12/2024 12:26 PM   Virtual Visit via Video Note   I, Curtis Burns, connected with  Curtis Burns  ([id number], 1996/04/10) on 02/12/24 at 12:15 PM EDT by a video-enabled telemedicine application and verified that I am speaking with the correct person using two identifiers.  Location: Patient:  Virtual Visit Location Patient: Home Provider: Virtual Visit Location Provider: Home Office   I discussed the limitations of evaluation and management by telemedicine and the availability of in person appointments. The patient expressed understanding and agreed to proceed.    History of Present Illness: Curtis Burns is a 28 y.o. who identifies as a male who was assigned male at birth, and is being seen today for back pain with nausea.  HPI: Back Pain This is a new problem. The current episode started in the past 7 days (Started 2 days ago on left side; Saw PCP but nothing was given or done-she ordered labs but he did not complete; Reports feels consistent with previous kidney stones). The problem occurs constantly. The problem has been waxing and waning since onset. The pain is present in the lumbar spine (left). The quality of the pain is described as aching and shooting. Radiates to: left groin. The pain is moderate. Associated symptoms include abdominal pain. Pertinent negatives include no bladder incontinence, bowel incontinence, dysuria, headaches, numbness, paresis, paresthesias, perianal numbness, tingling or weakness. (nausea) Risk factors: history of kidney stones and reports feels similar. He has tried nothing for the symptoms. The treatment provided no relief.     Problems:  Patient Active Problem List   Diagnosis Date Noted   Anxiety 10/26/2023   Left lower quadrant abdominal pain 03/12/2021   Syncopal seizure (HCC) 08/30/2019    Allergies:  Allergies  Allergen Reactions   Prednisone  Other (See Comments)    Reaction: extremity numbness  Medications:  Current Outpatient Medications:    ibuprofen  (ADVIL ) 800 MG tablet, Take 1 tablet (800 mg total) by mouth every 8 (eight) hours as needed., Disp: 30 tablet, Rfl: 0   tamsulosin  (FLOMAX ) 0.4 MG CAPS capsule, Take 1 capsule (0.4 mg total) by mouth daily., Disp: 30 capsule, Rfl: 3   busPIRone  (BUSPAR ) 10 MG tablet, TAKE 1  TABLET BY MOUTH THREE TIMES A DAY AS NEEDED, Disp: 270 tablet, Rfl: 0   citalopram  (CELEXA ) 10 MG tablet, TAKE 1 TABLET BY MOUTH EVERY DAY, Disp: 90 tablet, Rfl: 0  Observations/Objective: Patient is well-developed, well-nourished in no acute distress.  Resting comfortably at home.  Head is normocephalic, atraumatic.  No labored breathing.  Speech is clear and coherent with logical content.  Patient is alert and oriented at baseline.    Assessment and Plan: 1. Acute left-sided low back pain without sciatica (Primary) - ibuprofen  (ADVIL ) 800 MG tablet; Take 1 tablet (800 mg total) by mouth every 8 (eight) hours as needed.  Dispense: 30 tablet; Refill: 0 - tamsulosin  (FLOMAX ) 0.4 MG CAPS capsule; Take 1 capsule (0.4 mg total) by mouth daily.  Dispense: 30 capsule; Refill: 3  - Suspected Kidney stone - Advised I could not supply narcotic pain medication but would give Ibuprofen  800mg  Take one every 8 hours as needed - Add Tamsulosin   - May alternate Ibuprofen  with Tylenol  as needed - Strict precautions to follow up in person if symptoms worsen or urine output decreases - Push fluids - Work note provided - Seek in person evaluation if not improving  Follow Up Instructions: I discussed the assessment and treatment plan with the patient. The patient was provided an opportunity to ask questions and all were answered. The patient agreed with the plan and demonstrated an understanding of the instructions.  A copy of instructions were sent to the patient via MyChart unless otherwise noted below.    The patient was advised to call back or seek an in-person evaluation if the symptoms worsen or if the condition fails to improve as anticipated.    Curtis CHRISTELLA Dickinson, PA-C

## 2024-02-12 NOTE — Telephone Encounter (Signed)
 Requested Prescriptions  Refused Prescriptions Disp Refills   citalopram  (CELEXA ) 10 MG tablet [Pharmacy Med Name: CITALOPRAM  HBR 10 MG TABLET] 90 tablet 0    Sig: TAKE 1 TABLET BY MOUTH EVERY DAY     Psychiatry:  Antidepressants - SSRI Passed - 02/12/2024  4:33 PM      Passed - Valid encounter within last 6 months    Recent Outpatient Visits           2 days ago Acute left-sided low back pain without sciatica   [place] [facility] Melvin Pao, NP   3 months ago Anxiety   [place] [facility] Melvin Pao, NP

## 2024-03-21 ENCOUNTER — Other Ambulatory Visit: Payer: Self-pay | Admitting: Nurse Practitioner

## 2024-03-22 NOTE — Telephone Encounter (Signed)
 Requested by interface surescripts. Last OV 4 months ago . Will need appt for further refills.  Requested Prescriptions  Pending Prescriptions Disp Refills   citalopram  (CELEXA ) 10 MG tablet [Pharmacy Med Name: CITALOPRAM  HBR 10 MG TABLET] 90 tablet 0    Sig: TAKE 1 TABLET BY MOUTH EVERY DAY     Psychiatry:  Antidepressants - SSRI Passed - 03/22/2024  1:16 PM      Passed - Valid encounter within last 6 months    Recent Outpatient Visits           1 month ago Acute left-sided low back pain without sciatica   [place] [facility] Melvin Pao, NP   4 months ago Anxiety   [place] [facility] Melvin Pao, NP

## 2024-04-04 ENCOUNTER — Telehealth: Payer: Self-pay

## 2024-04-04 NOTE — Telephone Encounter (Unsigned)
 Copied from CRM #[id number]. Topic: General - Other >> Apr 04, 2024  2:04 PM [name] wrote: Reason for CRM: The patient has called to request a letter excusing them for jury duty due to a history of behavioral health concerns. Please contact the patient's significant other further if/when possible.

## 2024-04-08 NOTE — Telephone Encounter (Signed)
 Is this something you can write for the patient?

## 2024-04-11 NOTE — Telephone Encounter (Signed)
 Patient is due for follow up.  It can be virtual and we can discuss this further at the visit.

## 2024-04-20 ENCOUNTER — Ambulatory Visit: Admitting: Nurse Practitioner

## 2024-04-21 ENCOUNTER — Ambulatory Visit: Admitting: Nurse Practitioner

## 2024-04-25 ENCOUNTER — Encounter: Payer: Self-pay | Admitting: Nurse Practitioner

## 2024-04-25 ENCOUNTER — Telehealth (INDEPENDENT_AMBULATORY_CARE_PROVIDER_SITE_OTHER): Admitting: Nurse Practitioner

## 2024-04-25 DIAGNOSIS — F419 Anxiety disorder, unspecified: Secondary | ICD-10-CM

## 2024-04-25 MED ORDER — CITALOPRAM HYDROBROMIDE 20 MG PO TABS: 20.0000 mg | ORAL_TABLET | Freq: Every day | ORAL | 1 refills | Status: AC

## 2024-04-25 NOTE — Progress Notes (Signed)
 Scheduled

## 2024-04-25 NOTE — Assessment & Plan Note (Signed)
 Chronic.  Ongoing problem.  Will increase Citalopram  to 20mg  daily.  Okay to take 2 Buspar  when going into social settings to help with symptoms.  Follow up in 3 months.  Call sooner if concerns arise.

## 2024-04-25 NOTE — Progress Notes (Signed)
 There were no vitals taken for this visit.   Subjective:    Patient ID: Curtis Burns, male    DOB: 01-Jan-1996, 28 y.o.   MRN: [id number]  HPI: Curtis Burns is a 28 y.o. male  Chief Complaint  Patient presents with   Anxiety    Pt is needing note for jury duty    MOOD Patient states he has been taking the citalopram .  States he is tolerating it well.  However, recently he has been having more large get together's and it brings his anxiety up.  He starts to tremble and have heart racing.  States It is working well for his everyday.  He is taking the Buspar  once daily also.  He has jury duty coming up on 10/27.       Relevant past medical, surgical, family and social history reviewed and updated as indicated. Interim medical history since our last visit reviewed. Allergies and medications reviewed and updated.  Review of Systems  Psychiatric/Behavioral:  Negative for suicidal ideas. The patient is nervous/anxious.     Per HPI unless specifically indicated above     Objective:    There were no vitals taken for this visit.  Wt Readings from Last 3 Encounters:  10/26/23 247 lb (112 kg)  06/25/23 230 lb (104.3 kg)  02/19/22 220 lb (99.8 kg)    Physical Exam Vitals and nursing note reviewed.  Constitutional:      General: He is not in acute distress.    Appearance: He is not ill-appearing.  HENT:     Head: Normocephalic.     Right Ear: Hearing normal.     Left Ear: Hearing normal.     Nose: Nose normal.  Pulmonary:     Effort: Pulmonary effort is normal. No respiratory distress.  Neurological:     Mental Status: He is alert.  Psychiatric:        Mood and Affect: Mood normal.        Behavior: Behavior normal.        Thought Content: Thought content normal.        Judgment: Judgment normal.     Results for orders placed or performed during the hospital encounter of 04/14/23  POCT rapid strep A   Collection Time: 04/14/23  8:57 AM  Result Value  Ref Range   Rapid Strep A Screen Negative Negative      Assessment & Plan:   Problem List Items Addressed This Visit       Other   Anxiety - Primary   Chronic.  Ongoing problem.  Will increase Citalopram  to 20mg  daily.  Okay to take 2 Buspar  when going into social settings to help with symptoms.  Follow up in 3 months.  Call sooner if concerns arise.       Relevant Medications   citalopram  (CELEXA ) 20 MG tablet     Follow up plan: Return in about 3 months (around 07/26/2024) for Depression/Anxiety FU.   This visit was completed via MyChart due to the restrictions of the COVID-19 pandemic. All issues as above were discussed and addressed. Physical exam was done as above through visual confirmation on MyChart. If it was felt that the patient should be evaluated in the office, they were directed there. The patient verbally consented to this visit. Location of the patient: Home Location of the provider: Office Those involved with this call:  Provider: Darice Petty, NP CMA: Cena Maffucci, CMA Front Desk/Registration: Claretta Maiden This encounter was  conducted via video.  I spent 30 minutes dedicated to the care of this patient on the date of this encounter to include previsit review of plan of care, follow up and medications, face to face time with the patient, and post visit ordering of testing.

## 2024-05-02 ENCOUNTER — Ambulatory Visit: Admitting: Nurse Practitioner

## 2024-06-29 DIAGNOSIS — R6889 Other general symptoms and signs: Secondary | ICD-10-CM | POA: Diagnosis not present

## 2024-07-19 ENCOUNTER — Telehealth: Payer: Self-pay | Admitting: Nurse Practitioner

## 2024-07-19 NOTE — Telephone Encounter (Signed)
 Routing to provider to advise. Can appointments be combined? Looks like one is for regular follow up and the other is to discuss a referral.

## 2024-07-19 NOTE — Telephone Encounter (Signed)
 What is the referral for? Why does he want to see the specialist?

## 2024-07-19 NOTE — Telephone Encounter (Unsigned)
 Copied from CRM [id number]. Topic: Appointments - Appointment Scheduling >> Jul 18, 2024  8:45 AM Thersia BROCKS wrote: Patient/patient representative is calling to schedule an appointment. Refer to attachments for appointment information. >> Jul 18, 2024  2:06 PM Emylou G wrote: Wife called back.. wants to know if she can add the referral appt from the 28th to the 14th instead.. didn't want to do 2 appts.. wanted to combine?  Phone number for patient call back is good.. but wife would prefer the call back.SABRA Jacquline Louder: [phone].SABRA adv her may not be able to talk to her.

## 2024-07-26 ENCOUNTER — Other Ambulatory Visit: Payer: Self-pay | Admitting: Nurse Practitioner

## 2024-07-27 ENCOUNTER — Ambulatory Visit: Admitting: Nurse Practitioner

## 2024-07-27 NOTE — Telephone Encounter (Signed)
 Requested Prescriptions  Pending Prescriptions Disp Refills   busPIRone  (BUSPAR ) 10 MG tablet [Pharmacy Med Name: BUSPIRONE  HCL 10 MG TABLET] 270 tablet 0    Sig: TAKE 1 TABLET BY MOUTH THREE TIMES A DAY AS NEEDED     Psychiatry: Anxiolytics/Hypnotics - Non-controlled Passed - 07/27/2024 10:19 AM      Passed - Valid encounter within last 12 months    Recent Outpatient Visits           3 months ago Anxiety   [place] [facility] [place], Darice, NP   5 months ago Acute left-sided low back pain without sciatica   [place] [facility] Melvin Darice, NP   9 months ago Anxiety   [place] [facility] Melvin Darice, NP

## 2024-07-28 ENCOUNTER — Ambulatory Visit: Admitting: Nurse Practitioner

## 2024-07-28 NOTE — Progress Notes (Unsigned)
" ° °  There were no vitals taken for this visit.   Subjective:    Patient ID: Curtis Burns, male    DOB: 09/02/1995, 29 y.o.   MRN: [id number]  HPI: Curtis Burns is a 29 y.o. male  No chief complaint on file.  MOOD Patient states he has been taking the citalopram .  States he is tolerating it well.  However, recently he has been having more large get together's and it brings his anxiety up.  He starts to tremble and have heart racing.  States It is working well for his everyday.  He is taking the Buspar  once daily also.  He has jury duty coming up on 10/27.       Relevant past medical, surgical, family and social history reviewed and updated as indicated. Interim medical history since our last visit reviewed. Allergies and medications reviewed and updated.  Review of Systems  Psychiatric/Behavioral:  Negative for suicidal ideas. The patient is nervous/anxious.     Per HPI unless specifically indicated above     Objective:    There were no vitals taken for this visit.  Wt Readings from Last 3 Encounters:  10/26/23 247 lb (112 kg)  06/25/23 230 lb (104.3 kg)  02/19/22 220 lb (99.8 kg)    Physical Exam Vitals and nursing note reviewed.  Constitutional:      General: He is not in acute distress.    Appearance: He is not ill-appearing.  HENT:     Head: Normocephalic.     Right Ear: Hearing normal.     Left Ear: Hearing normal.     Nose: Nose normal.  Pulmonary:     Effort: Pulmonary effort is normal. No respiratory distress.  Neurological:     Mental Status: He is alert.  Psychiatric:        Mood and Affect: Mood normal.        Behavior: Behavior normal.        Thought Content: Thought content normal.        Judgment: Judgment normal.     Results for orders placed or performed during the hospital encounter of 04/14/23  POCT rapid strep A   Collection Time: 04/14/23  8:57 AM  Result Value Ref Range   Rapid Strep A Screen Negative Negative       Assessment & Plan:   Problem List Items Addressed This Visit   None     Follow up plan: No follow-ups on file.   This visit was completed via MyChart due to the restrictions of the COVID-19 pandemic. All issues as above were discussed and addressed. Physical exam was done as above through visual confirmation on MyChart. If it was felt that the patient should be evaluated in the office, they were directed there. The patient verbally consented to this visit. Location of the patient: Home Location of the provider: Office Those involved with this call:  Provider: Darice Petty, NP CMA: Cena Maffucci, CMA Front Desk/Registration: Claretta Maiden This encounter was conducted via video.  I spent 30 minutes dedicated to the care of this patient on the date of this encounter to include previsit review of plan of care, follow up and medications, face to face time with the patient, and post visit ordering of testing.      "

## 2024-08-10 ENCOUNTER — Ambulatory Visit: Admitting: Nurse Practitioner

## 2024-08-10 NOTE — Progress Notes (Unsigned)
" ° °  There were no vitals taken for this visit.   Subjective:    Patient ID: Stonewall Doss, male    DOB: [birth year], 29 y.o.   MRN: [id number]  HPI: Visente Kirker is a 29 y.o. male  No chief complaint on file.   Relevant past medical, surgical, family and social history reviewed and updated as indicated. Interim medical history since our last visit reviewed. Allergies and medications reviewed and updated.  Review of Systems  Per HPI unless specifically indicated above     Objective:    There were no vitals taken for this visit.  Wt Readings from Last 3 Encounters:  10/26/23 247 lb (112 kg)  06/25/23 230 lb (104.3 kg)  02/19/22 220 lb (99.8 kg)    Physical Exam  Results for orders placed or performed during the hospital encounter of 04/14/23  POCT rapid strep A   Collection Time: 04/14/23  8:57 AM  Result Value Ref Range   Rapid Strep A Screen Negative Negative      Assessment & Plan:   Problem List Items Addressed This Visit   None    Follow up plan: No follow-ups on file.      "
